# Patient Record
Sex: Female | Born: 1990 | Race: Black or African American | Hispanic: No | Marital: Single | Smoking: Former smoker
Health system: Southern US, Community
[De-identification: ages and names within clinical notes are randomized; demographics above are authoritative.]

## PROBLEM LIST (undated history)

## (undated) ENCOUNTER — Inpatient Hospital Stay (HOSPITAL_COMMUNITY): Payer: Self-pay

## (undated) DIAGNOSIS — O169 Unspecified maternal hypertension, unspecified trimester: Secondary | ICD-10-CM

## (undated) HISTORY — DX: Unspecified maternal hypertension, unspecified trimester: O16.9

---
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
## 2009-02-03 ENCOUNTER — Emergency Department: Payer: Self-pay | Admitting: Emergency Medicine

## 2012-07-27 ENCOUNTER — Emergency Department: Payer: Self-pay | Admitting: Emergency Medicine

## 2012-07-27 LAB — URINALYSIS, COMPLETE
Bilirubin,UR: NEGATIVE
Ph: 5 (ref 4.5–8.0)
Protein: NEGATIVE
RBC,UR: 3 /HPF (ref 0–5)
Squamous Epithelial: 8
WBC UR: 7 /HPF (ref 0–5)

## 2012-12-26 ENCOUNTER — Emergency Department: Payer: Self-pay | Admitting: Emergency Medicine

## 2012-12-26 LAB — COMPREHENSIVE METABOLIC PANEL
Albumin: 4.5 g/dL (ref 3.4–5.0)
BUN: 9 mg/dL (ref 7–18)
Bilirubin,Total: 0.7 mg/dL (ref 0.2–1.0)
Calcium, Total: 9.7 mg/dL (ref 8.5–10.1)
Creatinine: 0.77 mg/dL (ref 0.60–1.30)
EGFR (Non-African Amer.): >60
Glucose: 80 mg/dL (ref 65–99)
Potassium: 3.9 mmol/L (ref 3.5–5.1)
SGOT(AST): 24 U/L (ref 15–37)
SGPT (ALT): 17 U/L (ref 12–78)
Sodium: 135 mmol/L — ABNORMAL LOW (ref 136–145)
Total Protein: 8.9 g/dL — ABNORMAL HIGH (ref 6.4–8.2)

## 2012-12-26 LAB — URINALYSIS, COMPLETE
Bilirubin,UR: NEGATIVE
Leukocyte Esterase: NEGATIVE
Nitrite: NEGATIVE
Ph: 5 (ref 4.5–8.0)
Protein: 30
RBC,UR: 2 /HPF (ref 0–5)
Specific Gravity: 1.031 (ref 1.003–1.030)
Squamous Epithelial: 7

## 2012-12-26 LAB — CBC
MCH: 30.9 pg (ref 26.0–34.0)
MCHC: 35.1 g/dL (ref 32.0–36.0)
Platelet: 231 10*3/uL (ref 150–440)
RDW: 16.3 % — ABNORMAL HIGH (ref 11.5–14.5)
WBC: 10.5 10*3/uL (ref 3.6–11.0)

## 2012-12-26 LAB — GC/CHLAMYDIA PROBE AMP

## 2012-12-26 LAB — WET PREP, GENITAL

## 2014-07-04 ENCOUNTER — Emergency Department: Admit: 2014-07-04 | Disposition: A | Discharge status: Home / Self Care | Payer: Self-pay | Admitting: Internal Medicine

## 2014-07-04 LAB — URINALYSIS, COMPLETE
BILIRUBIN, UR: NEGATIVE
BLOOD: NEGATIVE
GLUCOSE, UR: NEGATIVE mg/dL (ref 0–75)
Ketone: NEGATIVE
Nitrite: NEGATIVE
Ph: 6 (ref 4.5–8.0)
Protein: 30
SPECIFIC GRAVITY: 1.026 (ref 1.003–1.030)

## 2014-11-23 ENCOUNTER — Emergency Department
Admission: EM | Admit: 2014-11-23 | Discharge: 2014-11-23 | Disposition: A | Discharge status: Home / Self Care | Payer: Medicaid Other | Attending: Emergency Medicine | Admitting: Emergency Medicine

## 2014-11-23 ENCOUNTER — Encounter: Payer: Self-pay | Admitting: Emergency Medicine

## 2014-11-23 DIAGNOSIS — R102 Pelvic and perineal pain: Secondary | ICD-10-CM | POA: Diagnosis present

## 2014-11-23 DIAGNOSIS — N390 Urinary tract infection, site not specified: Secondary | ICD-10-CM | POA: Diagnosis not present

## 2014-11-23 DIAGNOSIS — Z3202 Encounter for pregnancy test, result negative: Secondary | ICD-10-CM | POA: Diagnosis not present

## 2014-11-23 DIAGNOSIS — Z79899 Other long term (current) drug therapy: Secondary | ICD-10-CM | POA: Insufficient documentation

## 2014-11-23 LAB — WET PREP, GENITAL
CLUE CELLS WET PREP: NONE SEEN
TRICH WET PREP: NONE SEEN
YEAST WET PREP: NONE SEEN

## 2014-11-23 LAB — URINALYSIS COMPLETE WITH MICROSCOPIC (ARMC ONLY)
Bilirubin Urine: NEGATIVE
Glucose, UA: NEGATIVE mg/dL
Hgb urine dipstick: NEGATIVE
Ketones, ur: NEGATIVE mg/dL
Nitrite: NEGATIVE
PH: 6 (ref 5.0–8.0)
PROTEIN: 30 mg/dL — AB
Specific Gravity, Urine: 1.028 (ref 1.005–1.030)

## 2014-11-23 LAB — PREGNANCY, URINE: Preg Test, Ur: NEGATIVE

## 2014-11-23 LAB — CHLAMYDIA/NGC RT PCR (ARMC ONLY)
Chlamydia Tr: NOT DETECTED
N GONORRHOEAE: NOT DETECTED

## 2014-11-23 LAB — POCT PREGNANCY, URINE: Preg Test, Ur: NEGATIVE

## 2014-11-23 MED ORDER — CEPHALEXIN 500 MG PO CAPS: 500.0000 mg | ORAL_CAPSULE | Freq: Two times a day (BID) | ORAL | Status: DC

## 2014-11-23 NOTE — ED Notes (Addendum)
Pt to ed with c/o lower abd pain and pelvic pain x multiple years. States she has been seen for same but "they never find anything wrong with me" Pt states pain feels like "cramps".  Pt denies burning, denies vaginal d/c.  Pt initially reports LMP was recent but then states it was over a month ago and then reported,  "I don't really keep up with that stuff"  Pt texting on phone during triage and does not answer questions.

## 2014-11-23 NOTE — Discharge Instructions (Signed)

## 2014-11-23 NOTE — ED Notes (Signed)
Waiting for urine pregnancy results per patient's request before discharging patient.

## 2014-11-23 NOTE — ED Provider Notes (Addendum)
[facility] Emergency Department Provider Note  ____________________________________________  Time seen: 10:30 AM  I have reviewed the triage vital signs and the nursing notes.   HISTORY  Chief Complaint Pelvic Pain   HPI Ann Padilla is a 24 y.o. female who presents with complaints of mild pelvic cramping. She reports this is been intermittently occurring for several years and has never received diagnosis although she does tell me that she was told she has endometriosis when she was 24 years old. She admits to some mild vaginal discharge. Her last period was greater than a month ago but she has recently come off Implanon so she thinks that is why her periods are regular. She denies fevers chills. No dysuria. No nausea vomiting. No diarrhea. Pain is mild and more aching in nature and primarily central.     History reviewed. No pertinent past medical history.  There are no active problems to display for this patient.   History reviewed. No pertinent past surgical history.  Current Outpatient Rx  Name  Route  Sig  Dispense  Refill  . Multiple Vitamin (MULTIVITAMIN WITH MINERALS) TABS tablet   Oral   Take 1 tablet by mouth daily.           Allergies Review of patient's allergies indicates no known allergies.  History reviewed. No pertinent family history.  Social History Social History  Substance Use Topics  . Smoking status: Never Smoker   . Smokeless tobacco: None  . Alcohol Use: No    Review of Systems  Constitutional: Negative for fever. Eyes: Negative for discharge or redness ENT: Negative for sore throat Cardiovascular: Negative for chest pain. Respiratory: Negative for cough Gastrointestinal: Negative for abdominal pain, vomiting and diarrhea. Genitourinary: Negative for dysuria.positive vaginal discharge Musculoskeletal: Negative for back pain. Skin: Negative for rash. Neurological: Negative for headaches  Psychiatric:no  anxiety    ____________________________________________   PHYSICAL EXAM:  VITAL SIGNS: ED Triage Vitals  Enc Vitals Group     BP 11/23/14 0929 98/62 mmHg     Pulse Rate 11/23/14 0929 71     Resp 11/23/14 0929 20     Temp 11/23/14 0929 98.2 F (36.8 C)     Temp Source 11/23/14 0929 Oral     SpO2 11/23/14 0929 100 %     Weight 11/23/14 0929 115 lb (52.164 kg)     Height 11/23/14 0929  (1.575 m)     Head Cir --      Peak Flow --      Pain Score 11/23/14 0930 6     Pain Loc --      Pain Edu? --      Excl. in GC? --      Constitutional: Alert and oriented. Well appearing and in no distress.text on file Eyes: Conjunctivae are normal.  ENT   Head: Normocephalic and atraumatic.   Mouth/Throat: Mucous membranes are moist. Cardiovascular: Normal rate, regular rhythm.  Respiratory: Normal respiratory effort without tachypnea nor retractions. Breath sounds are clear and equal bilaterally.  Gastrointestinal: Soft and non-tender in all quadrants. No distention. There is no CVA tenderness. Genitourinary: no CMT, thin white discharge, no adnexal tenderness or masses Musculoskeletal: Nontender with normal range of motion in all extremities.  Neurologic:  Normal speech and language. . Skin:  Skin is warm, dry and intact. No rash noted. Psychiatric: Mood and affect are normal. Patient exhibits appropriate insight and judgment.  ____________________________________________    LABS (pertinent positives/negatives)  Labs Reviewed  WET PREP, GENITAL  CHLAMYDIA/NGC RT PCR ([facility] ONLY)  URINALYSIS COMPLETEWITH MICROSCOPIC ([facility] ONLY)  RPR    ____________________________________________   EKG  None  ____________________________________________    RADIOLOGY I have personally reviewed any xrays that were ordered on this patient: None  ____________________________________________   PROCEDURES  Procedure(s) performed: none  Critical Care  performed:none  ____________________________________________   INITIAL IMPRESSION / ASSESSMENT AND PLAN / ED COURSE  Pertinent labs & imaging results that were available during my care of the patient were reviewed by me and considered in my medical decision making (see chart for details).  Patient in no distress. This appears to be somewhat chronic problem. She does report vaginal discharge we will perform a GYN exam and send a wet prep and a pregnancy test. I do not think labs will be useful nor do I think imaging will be helpful as she does not have acute pain on one side or the other.   ----------------------------------------- 12:03 PM on 11/23/2014 -----------------------------------------  Wet prep is unremarkable. Urinesuspicious for a urinary tract infection. We will treat with antibiotics and have her follow-up with her cardiologist. ____________________________________________ Confirmed with nurse that POCT pregnancy was negative  FINAL CLINICAL IMPRESSION(S) / ED DIAGNOSES  Final diagnoses:  UTI (lower urinary tract infection)     Jene Every, MD 11/23/14 1203  Jene Every, MD 11/23/14 [phone]

## 2014-11-24 LAB — RPR: RPR Ser Ql: NONREACTIVE

## 2014-11-27 ENCOUNTER — Encounter: Payer: Self-pay | Admitting: Emergency Medicine

## 2014-11-27 ENCOUNTER — Emergency Department
Admission: EM | Admit: 2014-11-27 | Discharge: 2014-11-27 | Disposition: A | Discharge status: Home / Self Care | Payer: Medicaid Other | Attending: Student | Admitting: Student

## 2014-11-27 DIAGNOSIS — K5901 Slow transit constipation: Secondary | ICD-10-CM | POA: Insufficient documentation

## 2014-11-27 DIAGNOSIS — R110 Nausea: Secondary | ICD-10-CM

## 2014-11-27 DIAGNOSIS — Z792 Long term (current) use of antibiotics: Secondary | ICD-10-CM | POA: Diagnosis not present

## 2014-11-27 DIAGNOSIS — Z3202 Encounter for pregnancy test, result negative: Secondary | ICD-10-CM | POA: Insufficient documentation

## 2014-11-27 DIAGNOSIS — Z79899 Other long term (current) drug therapy: Secondary | ICD-10-CM | POA: Insufficient documentation

## 2014-11-27 MED ORDER — ONDANSETRON 8 MG PO TBDP: 8.0000 mg | ORAL_TABLET | Freq: Three times a day (TID) | ORAL | Status: DC | PRN

## 2014-11-27 MED ORDER — DOCUSATE SODIUM 100 MG PO CAPS: 100.0000 mg | ORAL_CAPSULE | Freq: Every day | ORAL | Status: AC | PRN

## 2014-11-27 NOTE — ED Notes (Signed)
Pt to ed with c/o nausea, was seen last week for UTI,  Pt states yesterday she felt nauseated but never vomited.  Pt appears in nad at this time.  texting on cell phone during triage.

## 2014-11-27 NOTE — ED Provider Notes (Signed)
[facility] Emergency Department Provider Note  ____________________________________________  Time seen: Approximately 8:40 AM  I have reviewed the triage vital signs and the nursing notes.   HISTORY  Chief Complaint Nausea    HPI Ann Padilla is a 24 y.o. female patient complaining of nausea status post eating what she thought was a back cantaloupe yesterday. Patient also states she's not had a bowel movement 3-4 days. Patient is currently being treated for urinary tract infection seen 5 days ago. Patient denies any urinary sclerae at this time. No palliative measures taken for the nausea or constipation. Patient rated her pain discomfort as 8/10.   History reviewed. No pertinent past medical history.  There are no active problems to display for this patient.   History reviewed. No pertinent past surgical history.  Current Outpatient Rx  Name  Route  Sig  Dispense  Refill  . cephALEXin (KEFLEX) 500 MG capsule   Oral   Take 1 capsule (500 mg total) by mouth 2 (two) times daily.   14 capsule   0   . docusate sodium (COLACE) 100 MG capsule   Oral   Take 1 capsule (100 mg total) by mouth daily as needed.   10 capsule   0   . Multiple Vitamin (MULTIVITAMIN WITH MINERALS) TABS tablet   Oral   Take 1 tablet by mouth daily.         . ondansetron (ZOFRAN ODT) 8 MG disintegrating tablet   Oral   Take 1 tablet (8 mg total) by mouth every 8 (eight) hours as needed for nausea or vomiting.   20 tablet   0     Allergies Review of patient's allergies indicates no known allergies.  History reviewed. No pertinent family history.  Social History Social History  Substance Use Topics  . Smoking status: Never Smoker   . Smokeless tobacco: None  . Alcohol Use: No    Review of Systems Constitutional: No fever/chills Eyes: No visual changes. ENT: No sore throat. Cardiovascular: Denies chest pain. Respiratory: Denies shortness of  breath. Gastrointestinal: No abdominal pain.  Nausea without vomiting  No diarrhea.  constipation. Genitourinary: Negative for dysuria. Musculoskeletal: Negative for back pain. Skin: Negative for rash. Neurological: Negative for headaches, focal weakness or numbness.  10-point ROS otherwise negative.  ____________________________________________   PHYSICAL EXAM:  VITAL SIGNS: ED Triage Vitals  Enc Vitals Group     BP 11/27/14 0824 107/71 mmHg     Pulse Rate 11/27/14 0824 78     Resp 11/27/14 0824 20     Temp 11/27/14 0824 98 F (36.7 C)     Temp Source 11/27/14 0824 Oral     SpO2 11/27/14 0824 100 %     Weight 11/27/14 0824 115 lb (52.164 kg)     Height 11/27/14 0824  (1.575 m)     Head Cir --      Peak Flow --      Pain Score 11/27/14 0824 8     Pain Loc --      Pain Edu? --      Excl. in GC? --     Constitutional: Alert and oriented. Well appearing and in no acute distress. Eyes: Conjunctivae are normal. PERRL. EOMI. Head: Atraumatic. Nose: No congestion/rhinnorhea. Mouth/Throat: Mucous membranes are moist.  Oropharynx non-erythematous. Neck: No stridor.   Cardiovascular: Normal rate, regular rhythm. Grossly normal heart sounds.  Good peripheral circulation. Respiratory: Normal respiratory effort.  No retractions. Lungs CTAB. Gastrointestinal: Soft and  nontender. Mild distention. No abdominal bruits. No CVA tenderness. Decreased bowel sounds Musculoskeletal: No lower extremity tenderness nor edema.  No joint effusions. Neurologic:  Normal speech and language. No gross focal neurologic deficits are appreciated. No gait instability. Skin:  Skin is warm, dry and intact. No rash noted. Psychiatric: Mood and affect are normal. Speech and behavior are normal.  ____________________________________________   LABS (all labs ordered are listed, but only abnormal results are displayed)  Labs Reviewed  POC URINE PREG, ED    ____________________________________________  EKG   ____________________________________________  RADIOLOGY   ____________________________________________   PROCEDURES  Procedure(s) performed: None  Critical Care performed: No  ____________________________________________   INITIAL IMPRESSION / ASSESSMENT AND PLAN / ED COURSE  Pertinent labs & imaging results that were available during my care of the patient were reviewed by me and considered in my medical decision making (see chart for details).  Nausea mild constipation. Patient given a prescription for Colace and Zofran. Patient working for the day. Advised to [facility] complaint [facility]. ____________________________________________   FINAL CLINICAL IMPRESSION(S) / ED DIAGNOSES  Final diagnoses:  Nausea  Slow transit constipation       Joni Reining, PA-C 11/27/14 1914  Gayla Doss, MD 11/27/14 1635

## 2014-11-28 LAB — POCT PREGNANCY, URINE: PREG TEST UR: NEGATIVE

## 2015-07-14 ENCOUNTER — Emergency Department: Payer: Medicaid Other

## 2015-07-14 ENCOUNTER — Emergency Department
Admission: EM | Admit: 2015-07-14 | Discharge: 2015-07-14 | Disposition: A | Discharge status: Home / Self Care | Payer: Medicaid Other | Attending: Emergency Medicine | Admitting: Emergency Medicine

## 2015-07-14 ENCOUNTER — Encounter: Payer: Self-pay | Admitting: Emergency Medicine

## 2015-07-14 DIAGNOSIS — Z3491 Encounter for supervision of normal pregnancy, unspecified, first trimester: Secondary | ICD-10-CM

## 2015-07-14 DIAGNOSIS — R109 Unspecified abdominal pain: Secondary | ICD-10-CM

## 2015-07-14 DIAGNOSIS — N760 Acute vaginitis: Secondary | ICD-10-CM | POA: Insufficient documentation

## 2015-07-14 DIAGNOSIS — R1030 Lower abdominal pain, unspecified: Secondary | ICD-10-CM | POA: Diagnosis present

## 2015-07-14 DIAGNOSIS — N390 Urinary tract infection, site not specified: Secondary | ICD-10-CM | POA: Diagnosis not present

## 2015-07-14 DIAGNOSIS — O26891 Other specified pregnancy related conditions, first trimester: Secondary | ICD-10-CM | POA: Diagnosis not present

## 2015-07-14 DIAGNOSIS — Z3A00 Weeks of gestation of pregnancy not specified: Secondary | ICD-10-CM | POA: Insufficient documentation

## 2015-07-14 DIAGNOSIS — B9689 Other specified bacterial agents as the cause of diseases classified elsewhere: Secondary | ICD-10-CM

## 2015-07-14 LAB — CBC WITH DIFFERENTIAL/PLATELET
Basophils Absolute: 0.1 10*3/uL (ref 0–0.1)
Basophils Relative: 1 %
Eosinophils Absolute: 0.1 10*3/uL (ref 0–0.7)
HEMATOCRIT: 41.7 % (ref 35.0–47.0)
HEMOGLOBIN: 14.1 g/dL (ref 12.0–16.0)
LYMPHS ABS: 2.6 10*3/uL (ref 1.0–3.6)
MCH: 31.2 pg (ref 26.0–34.0)
MCHC: 33.8 g/dL (ref 32.0–36.0)
MCV: 92.4 fL (ref 80.0–100.0)
Monocytes Absolute: 0.7 10*3/uL (ref 0.2–0.9)
Monocytes Relative: 6 %
NEUTROS ABS: 8 10*3/uL — AB (ref 1.4–6.5)
Neutrophils Relative %: 69 %
Platelets: 226 10*3/uL (ref 150–440)
RBC: 4.52 MIL/uL (ref 3.80–5.20)
RDW: 13.8 % (ref 11.5–14.5)
WBC: 11.4 10*3/uL — AB (ref 3.6–11.0)

## 2015-07-14 LAB — URINALYSIS COMPLETE WITH MICROSCOPIC (ARMC ONLY)
BILIRUBIN URINE: NEGATIVE
GLUCOSE, UA: NEGATIVE mg/dL
HGB URINE DIPSTICK: NEGATIVE
Nitrite: NEGATIVE
Protein, ur: NEGATIVE mg/dL
Specific Gravity, Urine: 1.031 — ABNORMAL HIGH (ref 1.005–1.030)
pH: 5 (ref 5.0–8.0)

## 2015-07-14 LAB — COMPREHENSIVE METABOLIC PANEL
ALK PHOS: 66 U/L (ref 38–126)
ALT: 12 U/L — ABNORMAL LOW (ref 14–54)
ANION GAP: 6 (ref 5–15)
AST: 21 U/L (ref 15–41)
Albumin: 4.6 g/dL (ref 3.5–5.0)
BILIRUBIN TOTAL: 0.7 mg/dL (ref 0.3–1.2)
BUN: 13 mg/dL (ref 6–20)
CALCIUM: 9.6 mg/dL (ref 8.9–10.3)
CO2: 23 mmol/L (ref 22–32)
Chloride: 112 mmol/L — ABNORMAL HIGH (ref 101–111)
Creatinine, Ser: 0.77 mg/dL (ref 0.44–1.00)
GFR calc Af Amer: >60 mL/min (ref >60)
GLUCOSE: 85 mg/dL (ref 65–99)
Potassium: 3.5 mmol/L (ref 3.5–5.1)
Sodium: 141 mmol/L (ref 135–145)
TOTAL PROTEIN: 8.3 g/dL — AB (ref 6.5–8.1)

## 2015-07-14 LAB — HCG, QUANTITATIVE, PREGNANCY: hCG, Beta Chain, Quant, S: 3428 m[IU]/mL — ABNORMAL HIGH (ref <5)

## 2015-07-14 LAB — WET PREP, GENITAL
SPERM: NONE SEEN
TRICH WET PREP: NONE SEEN
Yeast Wet Prep HPF POC: NONE SEEN

## 2015-07-14 MED ORDER — CEPHALEXIN 500 MG PO CAPS: 500.0000 mg | ORAL_CAPSULE | Freq: Two times a day (BID) | ORAL | Status: DC

## 2015-07-14 MED ORDER — CEPHALEXIN 500 MG PO CAPS
500.0000 mg | ORAL_CAPSULE | Freq: Once | ORAL | Status: AC
  Administered 2015-07-14: 500 mg via ORAL
  Filled 2015-07-14: qty 1

## 2015-07-14 NOTE — ED Provider Notes (Signed)
[facility] Emergency Department Provider Note   ____________________________________________  Time seen: I have reviewed the triage vital signs and the triage nursing note.  HISTORY  Chief Complaint Abdominal Pain   Historian Patient  HPI Ann Padilla is a 25 y.o. female who is here for evaluation of lower abdominal pelvic cramping pain. Her last period was sometime last month. She did take a pregnancy test at home and is found to be positive. She's had no significant discharge and no vaginal bleeding. No coughing or chest pain. No back pain. Pain is mild to moderate. No bowel or bladder symptoms, although she does state that she had a UTI recently completed antibiotics for that. Nothing makes it worse or better.    History reviewed. No pertinent past medical history.  There are no active problems to display for this patient.   No past surgical history on file.  Current Outpatient Rx  Name  Route  Sig  Dispense  Refill  . cephALEXin (KEFLEX) 500 MG capsule   Oral   Take 1 capsule (500 mg total) by mouth 2 (two) times daily.   14 capsule   0   . docusate sodium (COLACE) 100 MG capsule   Oral   Take 1 capsule (100 mg total) by mouth daily as needed.   10 capsule   0   . Multiple Vitamin (MULTIVITAMIN WITH MINERALS) TABS tablet   Oral   Take 1 tablet by mouth daily.         . ondansetron (ZOFRAN ODT) 8 MG disintegrating tablet   Oral   Take 1 tablet (8 mg total) by mouth every 8 (eight) hours as needed for nausea or vomiting.   20 tablet   0     Allergies Review of patient's allergies indicates no known allergies.  No family history on file.  Social History Social History  Substance Use Topics  . Smoking status: Never Smoker   . Smokeless tobacco: None  . Alcohol Use: No    Review of Systems  Constitutional: Negative for fever. Eyes: Negative for visual changes. ENT: Negative for sore throat. Cardiovascular: Negative for  chest pain. Respiratory: Negative for shortness of breath. Gastrointestinal: Negative for abdominal pain, vomiting and diarrhea. Genitourinary: Negative for dysuria. Musculoskeletal: Negative for back pain. Skin: Negative for rash. Neurological: Negative for headache. 10 point Review of Systems otherwise negative ____________________________________________   PHYSICAL EXAM:  VITAL SIGNS: ED Triage Vitals  Enc Vitals Group     BP 07/14/15 1801 123/79 mmHg     Pulse Rate 07/14/15 1801 91     Resp 07/14/15 1801 20     Temp 07/14/15 1801 98.4 F (36.9 C)     Temp Source 07/14/15 1801 Oral     SpO2 07/14/15 1801 99 %     Weight 07/14/15 1801 120 lb (54.432 kg)     Height 07/14/15 1801  (1.575 m)     Head Cir --      Peak Flow --      Pain Score --      Pain Loc --      Pain Edu? --      Excl. in GC? --      Constitutional: Alert and oriented. Well appearing and in no distress. HEENT   Head: Normocephalic and atraumatic.      Eyes: Conjunctivae are normal. PERRL. Normal extraocular movements.      Ears:         Nose: No congestion/rhinnorhea.  Mouth/Throat: Mucous membranes are moist.   Neck: No stridor. Cardiovascular/Chest: Normal rate, regular rhythm.  No murmurs, rubs, or gallops. Respiratory: Normal respiratory effort without tachypnea nor retractions. Breath sounds are clear and equal bilaterally. No wheezes/rales/rhonchi. Gastrointestinal: Soft. No distention, no guarding, no rebound. Very mild suprapubic discomfort.  Genitourinary/rectal:  No discharge, no odor.  No bleeding. No cmt or adnexal mass. Musculoskeletal: Nontender with normal range of motion in all extremities. No joint effusions.  No lower extremity tenderness.  No edema. Neurologic:  Normal speech and language. No gross or focal neurologic deficits are appreciated. Skin:  Skin is warm, dry and intact. No rash noted. Psychiatric: Mood and affect are normal. Speech and behavior are normal.  Patient exhibits appropriate insight and judgment.  ____________________________________________   EKG I, Governor Rooksebecca Katanya Schlie, MD, the attending physician have personally viewed and interpreted all ECGs.  none ____________________________________________  LABS (pertinent positives/negatives)  Beta hCG 3248  White blood count 11.4, hemoglobin 14.1 and platelet count 26 Comprehensive metabolic panel without significant abnormality Wet prep clue cells present, few white blood cells Urinalysis  urinalysis trace leukocytes, 6-30 white blood cells and rare bacteria. ____________________________________________  RADIOLOGY All Xrays were viewed by me. Imaging interpreted by Radiologist.  Ultrasound pelvic:  IMPRESSION: Probable early intrauterine gestational sac, but no yolk sac, fetal pole, or cardiac activity yet visualized. Recommend follow-up quantitative B-HCG levels and follow-up US in 14 days to confirm and assess viability. This recommendation follows SRU consensus guidelines: Diagnostic Criteria for Nonviable Pregnancy Early in the First Trimester. Malva Limes Engl J Med 2013; 369:1443-51. __________________________________________  PROCEDURES  Procedure(s) performed: None  Critical Care performed: None  ____________________________________________   ED COURSE / ASSESSMENT AND PLAN  Pertinent labs & imaging results that were available during my care of the patient were reviewed by me and considered in my medical decision making (see chart for details).   Patient is having some pelvic cramping and a small amount of vaginal bleeding/spotting yesterday. On pelvic exam here, no bleeding.    On abdominal exam, no focal right lower quadrant tenderness, and just a little bit of suprapubic discomfort. I'm not highly suspicious for another intra-abdominal surgical emergency such as appendicitis.  Patient is on clear when her last menstrual period was, but seems like probably about 5 weeks  ago, which is consistent with her gestational sac measuring approximately 5 weeks. No fetal heart rate appreciated on this potentially early exam.  Patient was instructed to follow with OB/GYN, with likely repeat beta hCG and ultrasound.  Symptoms do not seem highly concerning for ectopic pregnancy.  BV on wet prep - pt does not complain of symptoms -- will avoid treatment of asymptomatic in first trimester -- to follow up with OB gyn to discuss further.  UA consistent with urinary tract infection. A culture was sent. Patient was given Keflex as well as prescription.  CONSULTATIONS:   None   Patient / Family / Caregiver informed of clinical course, medical decision-making process, and agree with plan.   I discussed return precautions, follow-up instructions, and discharged instructions with patient and/or family.   ___________________________________________   FINAL CLINICAL IMPRESSION(S) / ED DIAGNOSES   Final diagnoses:  Abdominal pain  First trimester pregnancy  BV (bacterial vaginosis)  UTI (lower urinary tract infection)              Note: This dictation was prepared with Dragon dictation. Any transcriptional errors that result from this process are unintentional   Governor Rooksebecca Meryl Ponder, MD 07/14/15 2337

## 2015-07-14 NOTE — ED Notes (Signed)
Patient transported to Ultrasound 

## 2015-07-14 NOTE — ED Notes (Signed)
Pt up to attempt to obtain urine sample.

## 2015-07-14 NOTE — ED Notes (Signed)
States has had abdominal cramping yesterday, states took home pregnancy and got positive result, unsure last period, denies vaginal bleeding.

## 2015-07-14 NOTE — Discharge Instructions (Signed)
You were evaluated for pelvic cramping, and found to have positive pregnancy test. Ultrasound shows early sac, but no heart rate, and this may be because the pregnancy is too early, however failed pregnancy or miscarriage is still a consideration.  He will need repeat pregnancy hormone level and ultrasound, usually in 1-2 weeks. Call OB/GYN to make an appointment for follow-up.  Return to the emergency department for any worsening condition including any new or worsening abdominal pain, vaginal bleeding, fever, dizziness or passing out, or any other symptoms concerning to you.   First Trimester of Pregnancy The first trimester of pregnancy is from week 1 until the end of week 12 (months 1 through 3). A week after a sperm fertilizes an egg, the egg will implant on the wall of the uterus. This embryo will begin to develop into a baby. Genes from you and your partner are forming the baby. The female genes determine whether the baby is a boy or a girl. At 6-8 weeks, the eyes and face are formed, and the heartbeat can be seen on ultrasound. At the end of 12 weeks, all the baby's organs are formed.  Now that you are pregnant, you will want to do everything you can to have a healthy baby. Two of the most important things are to get good prenatal care and to follow your health care provider's instructions. Prenatal care is all the medical care you receive before the baby's birth. This care will help prevent, find, and treat any problems during the pregnancy and childbirth. BODY CHANGES Your body goes through many changes during pregnancy. The changes vary from woman to woman.   You may gain or lose a couple of pounds at first.  You may feel sick to your stomach (nauseous) and throw up (vomit). If the vomiting is uncontrollable, call your health care provider.  You may tire easily.  You may develop headaches that can be relieved by medicines approved by your health care provider.  You may urinate more  often. Painful urination may mean you have a bladder infection.  You may develop heartburn as a result of your pregnancy.  You may develop constipation because certain hormones are causing the muscles that push waste through your intestines to slow down.  You may develop hemorrhoids or swollen, bulging veins (varicose veins).  Your breasts may begin to grow larger and become tender. Your nipples may stick out more, and the tissue that surrounds them (areola) may become darker.  Your gums may bleed and may be sensitive to brushing and flossing.  Dark spots or blotches (chloasma, mask of pregnancy) may develop on your face. This will likely fade after the baby is born.  Your menstrual periods will stop.  You may have a loss of appetite.  You may develop cravings for certain kinds of food.  You may have changes in your emotions from day to day, such as being excited to be pregnant or being concerned that something may go wrong with the pregnancy and baby.  You may have more vivid and strange dreams.  You may have changes in your hair. These can include thickening of your hair, rapid growth, and changes in texture. Some women also have hair loss during or after pregnancy, or hair that feels dry or thin. Your hair will most likely return to normal after your baby is born. WHAT TO EXPECT AT YOUR PRENATAL VISITS During a routine prenatal visit:  You will be weighed to make sure you and the  baby are growing normally.  Your blood pressure will be taken.  Your abdomen will be measured to track your baby's growth.  The fetal heartbeat will be listened to starting around week 10 or 12 of your pregnancy.  Test results from any previous visits will be discussed. Your health care provider may ask you:  How you are feeling.  If you are feeling the baby move.  If you have had any abnormal symptoms, such as leaking fluid, bleeding, severe headaches, or abdominal cramping.  If you are using  any tobacco products, including cigarettes, chewing tobacco, and electronic cigarettes.  If you have any questions. Other tests that may be performed during your first trimester include:  Blood tests to find your blood type and to check for the presence of any previous infections. They will also be used to check for low iron levels (anemia) and Rh antibodies. Later in the pregnancy, blood tests for diabetes will be done along with other tests if problems develop.  Urine tests to check for infections, diabetes, or protein in the urine.  An ultrasound to confirm the proper growth and development of the baby.  An amniocentesis to check for possible genetic problems.  Fetal screens for spina bifida and Down syndrome.  You may need other tests to make sure you and the baby are doing well.  HIV (human immunodeficiency virus) testing. Routine prenatal testing includes screening for HIV, unless you choose not to have this test. HOME CARE INSTRUCTIONS  Medicines  Follow your health care provider's instructions regarding medicine use. Specific medicines may be either safe or unsafe to take during pregnancy.  Take your prenatal vitamins as directed.  If you develop constipation, try taking a stool softener if your health care provider approves. Diet  Eat regular, well-balanced meals. Choose a variety of foods, such as meat or vegetable-based protein, fish, milk and low-fat dairy products, vegetables, fruits, and whole grain breads and cereals. Your health care provider will help you determine the amount of weight gain that is right for you.  Avoid raw meat and uncooked cheese. These carry germs that can cause birth defects in the baby.  Eating four or five small meals rather than three large meals a day may help relieve nausea and vomiting. If you start to feel nauseous, eating a few soda crackers can be helpful. Drinking liquids between meals instead of during meals also seems to help nausea  and vomiting.  If you develop constipation, eat more high-fiber foods, such as fresh vegetables or fruit and whole grains. Drink enough fluids to keep your urine clear or pale yellow. Activity and Exercise  Exercise only as directed by your health care provider. Exercising will help you:  Control your weight.  Stay in shape.  Be prepared for labor and delivery.  Experiencing pain or cramping in the lower abdomen or low back is a good sign that you should stop exercising. Check with your health care provider before continuing normal exercises.  Try to avoid standing for long periods of time. Move your legs often if you must stand in one place for a long time.  Avoid heavy lifting.  Wear low-heeled shoes, and practice good posture.  You may continue to have sex unless your health care provider directs you otherwise. Relief of Pain or Discomfort  Wear a good support bra for breast tenderness.   Take warm sitz baths to soothe any pain or discomfort caused by hemorrhoids. Use hemorrhoid cream if your health care provider  approves.   Rest with your legs elevated if you have leg cramps or low back pain.  If you develop varicose veins in your legs, wear support hose. Elevate your feet for 15 minutes, 3-4 times a day. Limit salt in your diet. Prenatal Care  Schedule your prenatal visits by the twelfth week of pregnancy. They are usually scheduled monthly at first, then more often in the last 2 months before delivery.  Write down your questions. Take them to your prenatal visits.  Keep all your prenatal visits as directed by your health care provider. Safety  Wear your seat belt at all times when driving.  Make a list of emergency phone numbers, including numbers for family, friends, the hospital, and police and fire departments. General Tips  Ask your health care provider for a referral to a local prenatal education class. Begin classes no later than at the beginning of month 6  of your pregnancy.  Ask for help if you have counseling or nutritional needs during pregnancy. Your health care provider can offer advice or refer you to specialists for help with various needs.  Do not use hot tubs, steam rooms, or saunas.  Do not douche or use tampons or scented sanitary pads.  Do not cross your legs for long periods of time.  Avoid cat litter boxes and soil used by cats. These carry germs that can cause birth defects in the baby and possibly loss of the fetus by miscarriage or stillbirth.  Avoid all smoking, herbs, alcohol, and medicines not prescribed by your health care provider. Chemicals in these affect the formation and growth of the baby.  Do not use any tobacco products, including cigarettes, chewing tobacco, and electronic cigarettes. If you need help quitting, ask your health care provider. You may receive counseling support and other resources to help you quit.  Schedule a dentist appointment. At home, brush your teeth with a soft toothbrush and be gentle when you floss. SEEK MEDICAL CARE IF:   You have dizziness.  You have mild pelvic cramps, pelvic pressure, or nagging pain in the abdominal area.  You have persistent nausea, vomiting, or diarrhea.  You have a bad smelling vaginal discharge.  You have pain with urination.  You notice increased swelling in your face, hands, legs, or ankles. SEEK IMMEDIATE MEDICAL CARE IF:   You have a fever.  You are leaking fluid from your vagina.  You have spotting or bleeding from your vagina.  You have severe abdominal cramping or pain.  You have rapid weight gain or loss.  You vomit blood or material that looks like coffee grounds.  You are exposed to [place] measles and have never had them.  You are exposed to fifth disease or chickenpox.  You develop a severe headache.  You have shortness of breath.  You have any kind of trauma, such as from a fall or a car accident.   This information is not  intended to replace advice given to you by your health care provider. Make sure you discuss any questions you have with your health care provider.   Document Released: 02/18/2001 Document Revised: 03/17/2014 Document Reviewed: 01/04/2013 Elsevier Interactive Patient Education 2016 [employer]  Abdominal Pain, Adult Many things can cause abdominal pain. Usually, abdominal pain is not caused by a disease and will improve without treatment. It can often be observed and treated at home. Your health care provider will do a physical exam and possibly order blood tests and X-rays to help determine  the seriousness of your pain. However, in many cases, more time must pass before a clear cause of the pain can be found. Before that point, your health care provider may not know if you need more testing or further treatment. HOME CARE INSTRUCTIONS Monitor your abdominal pain for any changes. The following actions may help to alleviate any discomfort you are experiencing:  Only take over-the-counter or prescription medicines as directed by your health care provider.  Do not take laxatives unless directed to do so by your health care provider.  Try a clear liquid diet (broth, tea, or water) as directed by your health care provider. Slowly move to a bland diet as tolerated. SEEK MEDICAL CARE IF:  You have unexplained abdominal pain.  You have abdominal pain associated with nausea or diarrhea.  You have pain when you urinate or have a bowel movement.  You experience abdominal pain that wakes you in the night.  You have abdominal pain that is worsened or improved by eating food.  You have abdominal pain that is worsened with eating fatty foods.  You have a fever. SEEK IMMEDIATE MEDICAL CARE IF:  Your pain does not go away within 2 hours.  You keep throwing up (vomiting).  Your pain is felt only in portions of the abdomen, such as the right side or the left lower portion of the abdomen.  You  pass bloody or black tarry stools. MAKE SURE YOU:  Understand these instructions.  Will watch your condition.  Will get help right away if you are not doing well or get worse.   This information is not intended to replace advice given to you by your health care provider. Make sure you discuss any questions you have with your health care provider.   Document Released: 12/04/2004 Document Revised: 11/15/2014 Document Reviewed: 11/03/2012 Elsevier Interactive Patient Education 2016 [employer]   Pregnancy and Urinary Tract Infection A urinary tract infection (UTI) is a bacterial infection of the urinary tract. Infection of the urinary tract can include the ureters, kidneys (pyelonephritis), bladder (cystitis), and urethra (urethritis). All pregnant women should be screened for bacteria in the urinary tract. Identifying and treating a UTI will decrease the risk of preterm labor and developing more serious infections in both the mother and baby. CAUSES Bacteria germs cause almost all UTIs.  RISK FACTORS Many factors can increase your chances of getting a UTI during pregnancy. These include:  Having a short urethra.  Poor toilet and hygiene habits.  Sexual intercourse.  Blockage of urine along the urinary tract.  Problems with the pelvic muscles or nerves.  Diabetes.  Obesity.  Bladder problems after having several children.  Previous history of UTI. SIGNS AND SYMPTOMS   Pain, burning, or a stinging feeling when urinating.  Suddenly feeling the need to urinate right away (urgency).  Loss of bladder control (urinary incontinence).  Frequent urination, more than is common with pregnancy.  Lower abdominal or back discomfort.  Cloudy urine.  Blood in the urine (hematuria).  Fever. When the kidneys are infected, the symptoms may be:  Back pain.  Flank pain on the right side more so than the left.  Fever.  Chills.  Nausea.  Vomiting. DIAGNOSIS  A urinary  tract infection is usually diagnosed through urine tests. Additional tests and procedures are sometimes done. These may include:  Ultrasound exam of the kidneys, ureters, bladder, and urethra.  Looking in the bladder with a lighted tube (cystoscopy). TREATMENT Typically, UTIs can be treated with antibiotic  medicines.  HOME CARE INSTRUCTIONS   Only take over-the-counter or prescription medicines as directed by your health care provider. If you were prescribed antibiotics, take them as directed. Finish them even if you start to feel better.  Drink enough fluids to keep your urine clear or pale yellow.  Do not have sexual intercourse until the infection is gone and your health care provider says it is okay.  Make sure you are tested for UTIs throughout your pregnancy. These infections often come back. Preventing a UTI in the Future  Practice good toilet habits. Always wipe from front to back. Use the tissue only once.  Do not hold your urine. Empty your bladder as soon as possible when the urge comes.  Do not douche or use deodorant sprays.  Wash with soap and warm water around the genital area and the anus.  Empty your bladder before and after sexual intercourse.  Wear underwear with a cotton crotch.  Avoid caffeine and carbonated drinks. They can irritate the bladder.  Drink cranberry juice or take cranberry pills. This may decrease the risk of getting a UTI.  Do not drink alcohol.  Keep all your appointments and tests as scheduled. SEEK MEDICAL CARE IF:   Your symptoms get worse.  You are still having fevers 2 or more days after treatment begins.  You have a rash.  You feel that you are having problems with medicines prescribed.  You have abnormal vaginal discharge. SEEK IMMEDIATE MEDICAL CARE IF:   You have back or flank pain.  You have chills.  You have blood in your urine.  You have nausea and vomiting.  You have contractions of your uterus.  You have  a gush of fluid from the vagina. MAKE SURE YOU:  Understand these instructions.   Will watch your condition.   Will get help right away if you are not doing well or get worse.    This information is not intended to replace advice given to you by your health care provider. Make sure you discuss any questions you have with your health care provider.   Document Released: 06/21/2010 Document Revised: 12/15/2012 Document Reviewed: 09/23/2012 Elsevier Interactive Patient Education [employer]

## 2015-07-14 NOTE — ED Notes (Signed)
Pt sleeping, call bell at left side.

## 2015-07-15 LAB — CHLAMYDIA/NGC RT PCR (ARMC ONLY)
CHLAMYDIA TR: NOT DETECTED
N gonorrhoeae: NOT DETECTED

## 2015-07-17 LAB — URINE CULTURE

## 2015-09-24 ENCOUNTER — Emergency Department
Admission: EM | Admit: 2015-09-24 | Discharge: 2015-09-24 | Disposition: A | Discharge status: Home / Self Care | Payer: Medicaid Other | Attending: Emergency Medicine | Admitting: Emergency Medicine

## 2015-09-24 ENCOUNTER — Emergency Department: Payer: Medicaid Other

## 2015-09-24 ENCOUNTER — Encounter: Payer: Self-pay | Admitting: Emergency Medicine

## 2015-09-24 DIAGNOSIS — R102 Pelvic and perineal pain: Secondary | ICD-10-CM

## 2015-09-24 DIAGNOSIS — A749 Chlamydial infection, unspecified: Secondary | ICD-10-CM

## 2015-09-24 DIAGNOSIS — N760 Acute vaginitis: Secondary | ICD-10-CM

## 2015-09-24 DIAGNOSIS — O98312 Other infections with a predominantly sexual mode of transmission complicating pregnancy, second trimester: Secondary | ICD-10-CM | POA: Diagnosis not present

## 2015-09-24 DIAGNOSIS — N949 Unspecified condition associated with female genital organs and menstrual cycle: Secondary | ICD-10-CM

## 2015-09-24 DIAGNOSIS — O2342 Unspecified infection of urinary tract in pregnancy, second trimester: Secondary | ICD-10-CM | POA: Diagnosis not present

## 2015-09-24 DIAGNOSIS — Z3A14 14 weeks gestation of pregnancy: Secondary | ICD-10-CM | POA: Insufficient documentation

## 2015-09-24 DIAGNOSIS — Z79899 Other long term (current) drug therapy: Secondary | ICD-10-CM | POA: Insufficient documentation

## 2015-09-24 DIAGNOSIS — O23592 Infection of other part of genital tract in pregnancy, second trimester: Secondary | ICD-10-CM | POA: Insufficient documentation

## 2015-09-24 DIAGNOSIS — A5600 Chlamydial infection of lower genitourinary tract, unspecified: Secondary | ICD-10-CM | POA: Diagnosis not present

## 2015-09-24 DIAGNOSIS — B9689 Other specified bacterial agents as the cause of diseases classified elsewhere: Secondary | ICD-10-CM

## 2015-09-24 DIAGNOSIS — O26892 Other specified pregnancy related conditions, second trimester: Secondary | ICD-10-CM

## 2015-09-24 LAB — URINALYSIS COMPLETE WITH MICROSCOPIC (ARMC ONLY)
Bilirubin Urine: NEGATIVE
Glucose, UA: NEGATIVE mg/dL
Nitrite: NEGATIVE
PH: 6 (ref 5.0–8.0)
PROTEIN: NEGATIVE mg/dL
Specific Gravity, Urine: 1.014 (ref 1.005–1.030)

## 2015-09-24 LAB — CBC WITH DIFFERENTIAL/PLATELET
BASOS ABS: 0 10*3/uL (ref 0–0.1)
BASOS PCT: 0 %
Eosinophils Absolute: 0 10*3/uL (ref 0–0.7)
Eosinophils Relative: 0 %
HEMATOCRIT: 34.7 % — AB (ref 35.0–47.0)
HEMOGLOBIN: 12.1 g/dL (ref 12.0–16.0)
Lymphocytes Relative: 15 %
Lymphs Abs: 1.8 10*3/uL (ref 1.0–3.6)
MCH: 31.6 pg (ref 26.0–34.0)
MCHC: 34.8 g/dL (ref 32.0–36.0)
MCV: 90.9 fL (ref 80.0–100.0)
MONO ABS: 0.7 10*3/uL (ref 0.2–0.9)
Monocytes Relative: 6 %
NEUTROS ABS: 9.3 10*3/uL — AB (ref 1.4–6.5)
NEUTROS PCT: 79 %
Platelets: 201 10*3/uL (ref 150–440)
RBC: 3.82 MIL/uL (ref 3.80–5.20)
RDW: 14.2 % (ref 11.5–14.5)
WBC: 11.8 10*3/uL — AB (ref 3.6–11.0)

## 2015-09-24 LAB — WET PREP, GENITAL
Sperm: NONE SEEN
Trich, Wet Prep: NONE SEEN
Yeast Wet Prep HPF POC: NONE SEEN

## 2015-09-24 LAB — CHLAMYDIA/NGC RT PCR (ARMC ONLY)
CHLAMYDIA TR: DETECTED — AB
N GONORRHOEAE: NOT DETECTED

## 2015-09-24 LAB — HCG, QUANTITATIVE, PREGNANCY: hCG, Beta Chain, Quant, S: 71738 m[IU]/mL — ABNORMAL HIGH (ref <5)

## 2015-09-24 MED ORDER — CEPHALEXIN 500 MG PO CAPS
500.0000 mg | ORAL_CAPSULE | Freq: Once | ORAL | Status: AC
  Administered 2015-09-24: 500 mg via ORAL
  Filled 2015-09-24: qty 1

## 2015-09-24 MED ORDER — CEPHALEXIN 500 MG PO CAPS: 500.0000 mg | ORAL_CAPSULE | Freq: Two times a day (BID) | ORAL | Status: DC

## 2015-09-24 MED ORDER — AZITHROMYCIN 500 MG PO TABS
1000.0000 mg | ORAL_TABLET | Freq: Once | ORAL | Status: AC
  Administered 2015-09-24: 1000 mg via ORAL
  Filled 2015-09-24: qty 2

## 2015-09-24 MED ORDER — ACETAMINOPHEN 325 MG PO TABS
650.0000 mg | ORAL_TABLET | Freq: Once | ORAL | Status: AC
  Administered 2015-09-24: 650 mg via ORAL
  Filled 2015-09-24: qty 2

## 2015-09-24 NOTE — Discharge Instructions (Signed)
You were evaluated for pelvic pressure and back pain and your ultrasound and evaluation are reassuring.  As we discussed, although your symptoms could be indicators of potential miscarriage, at the present time there is no evidence of miscarriage.  We discussed your vaginal swabs to suspect bacterial vaginitis and since your OB/GYN doctor had postponed treatment, I am going to leave the decision for treatment to your maternal-fetal medicine OB/GYN.  Keep your appointment for next week, you may call tomorrow to make sure your physician does not want you to make an appointment for earlier this week.  Return to the emergency department for any vaginal bleeding, new or worsening abdominal pain or pressure, vaginal fluid leakage, fever, or any other symptoms concerning to you.   Abdominal Pain During Pregnancy Abdominal pain is common in pregnancy. Most of the time, it does not cause harm. There are many causes of abdominal pain. Some causes are more serious than others. Some of the causes of abdominal pain in pregnancy are easily diagnosed. Occasionally, the diagnosis takes time to understand. Other times, the cause is not determined. Abdominal pain can be a sign that something is very wrong with the pregnancy, or the pain may have nothing to do with the pregnancy at all. For this reason, always tell your health care provider if you have any abdominal discomfort. HOME CARE INSTRUCTIONS  Monitor your abdominal pain for any changes. The following actions may help to alleviate any discomfort you are experiencing:  Do not have sexual intercourse or put anything in your vagina until your symptoms go away completely.  Get plenty of rest until your pain improves.  Drink clear fluids if you feel nauseous. Avoid solid food as long as you are uncomfortable or nauseous.  Only take over-the-counter or prescription medicine as directed by your health care provider.  Keep all follow-up appointments with your  health care provider. SEEK IMMEDIATE MEDICAL CARE IF:  You are bleeding, leaking fluid, or passing tissue from the vagina.  You have increasing pain or cramping.  You have persistent vomiting.  You have painful or bloody urination.  You have a fever.  You notice a decrease in your baby's movements.  You have extreme weakness or feel faint.  You have shortness of breath, with or without abdominal pain.  You develop a severe headache with abdominal pain.  You have abnormal vaginal discharge with abdominal pain.  You have persistent diarrhea.  You have abdominal pain that continues even after rest, or gets worse. MAKE SURE YOU:   Understand these instructions.  Will watch your condition.  Will get help right away if you are not doing well or get worse.   This information is not intended to replace advice given to you by your health care provider. Make sure you discuss any questions you have with your health care provider.   Document Released: 02/24/2005 Document Revised: 12/15/2012 Document Reviewed: 09/23/2012 Elsevier Interactive Patient Education 2016 Elsevier Inc.  Bacterial Vaginosis Bacterial vaginosis is an infection of the vagina. It happens when too many germs (bacteria) grow in the vagina. Having this infection puts you at risk for getting other infections from sex. Treating this infection can help lower your risk for other infections, such as:   Chlamydia.  Gonorrhea.  HIV.  Herpes. HOME CARE  Take your medicine as told by your doctor.  Finish your medicine even if you start to feel better.  Tell your sex partner that you have an infection. They should see their doctor  for treatment.  During treatment:  Avoid sex or use condoms correctly.  Do not douche.  Do not drink alcohol unless your doctor tells you it is ok.  Do not breastfeed unless your doctor tells you it is ok. GET HELP IF:  You are not getting better after 3 days of  treatment.  You have more grey fluid (discharge) coming from your vagina than before.  You have more pain than before.  You have a fever. MAKE SURE YOU:   Understand these instructions.  Will watch your condition.  Will get help right away if you are not doing well or get worse.   This information is not intended to replace advice given to you by your health care provider. Make sure you discuss any questions you have with your health care provider.   Document Released: 12/04/2007 Document Revised: 03/17/2014 Document Reviewed: 10/06/2012 Elsevier Interactive Patient Education 2016 [employer]. Threatened Miscarriage A threatened miscarriage occurs when you have vaginal bleeding during your first 20 weeks of pregnancy but the pregnancy has not ended. If you have vaginal bleeding during this time, your health care provider will do tests to make sure you are still pregnant. If the tests show you are still pregnant and the developing baby (fetus) inside your womb (uterus) is still growing, your condition is considered a threatened miscarriage. A threatened miscarriage does not mean your pregnancy will end, but it does increase the risk of losing your pregnancy (complete miscarriage). CAUSES  The cause of a threatened miscarriage is usually not known. If you go on to have a complete miscarriage, the most common cause is an abnormal number of chromosomes in the developing baby. Chromosomes are the structures inside cells that hold all your genetic material. Some causes of vaginal bleeding that do not result in miscarriage include:  Having sex.  Having an infection.  Normal hormone changes of pregnancy.  Bleeding that occurs when an egg implants in your uterus. RISK FACTORS Risk factors for bleeding in early pregnancy include:  Obesity.  Smoking.  Drinking excessive amounts of alcohol or caffeine.  Recreational drug use. SIGNS AND SYMPTOMS  Light vaginal bleeding.  Mild  abdominal pain or cramps. DIAGNOSIS  If you have bleeding with or without abdominal pain before 20 weeks of pregnancy, your health care provider will do tests to check whether you are still pregnant. One important test involves using sound waves and a computer (ultrasound) to create images of the inside of your uterus. Other tests include an internal exam of your vagina and uterus (pelvic exam) and measurement of your baby's heart rate.  You may be diagnosed with a threatened miscarriage if:  Ultrasound testing shows you are still pregnant.  Your baby's heart rate is strong.  A pelvic exam shows that the opening between your uterus and your vagina (cervix) is closed.  Your heart rate and blood pressure are stable.  Blood tests confirm you are still pregnant. TREATMENT  No treatments have been shown to prevent a threatened miscarriage from going on to a complete miscarriage. However, the right home care is important.  HOME CARE INSTRUCTIONS   Make sure you keep all your appointments for prenatal care. This is very important.  Get plenty of rest.  Do not have sex or use tampons if you have vaginal bleeding.  Do not douche.  Do not smoke or use recreational drugs.  Do not drink alcohol.  Avoid caffeine. SEEK MEDICAL CARE IF:  You have light vaginal bleeding or  spotting while pregnant.  You have abdominal pain or cramping.  You have a fever. SEEK IMMEDIATE MEDICAL CARE IF:  You have heavy vaginal bleeding.  You have blood clots coming from your vagina.  You have severe low back pain or abdominal cramps.  You have fever, chills, and severe abdominal pain. MAKE SURE YOU:  Understand these instructions.  Will watch your condition.  Will get help right away if you are not doing well or get worse.   This information is not intended to replace advice given to you by your health care provider. Make sure you discuss any questions you have with your health care provider.    Document Released: 02/24/2005 Document Revised: 03/01/2013 Document Reviewed: 12/21/2012 Elsevier Interactive Patient Education 2016 Elsevier Inc.    Chlamydia, Female Chlamydia is an infection. It is spread through sexual contact. Chlamydia can be in different areas of the body. These areas include the cervix, urethra, throat, or rectum. You may not know you have chlamydia because many people never develop the symptoms. Chlamydia is not difficult to treat once you know you have it. However, if it is left untreated, chlamydia can lead to more serious health problems.  CAUSES  Chlamydia is caused by bacteria. It is a sexually transmitted disease. It is passed from an infected partner during intimate contact. This contact could be with the genitals, mouth, or rectal area. Chlamydia can also be passed from mothers to babies during birth. SIGNS AND SYMPTOMS  There may not be any symptoms. This is often the case early in the infection. If symptoms develop, they may include:  Mild pain and discomfort when urinating.  Redness, soreness, and swelling (inflammation) of the rectum.  Vaginal discharge.  Painful intercourse.  Abdominal pain.  Bleeding between menstrual periods. DIAGNOSIS  To diagnose this infection, your health care provider will do a pelvic exam. Cultures will be taken of the vagina, cervix, urine, and possibly the rectum to verify the diagnosis.  TREATMENT You will be given antibiotic medicines. If you are pregnant, certain types of antibiotics will need to be avoided. Any sexual partners should also be treated, even if they do not show symptoms. Your health care provider may test you for infection again 3 months after treatment. HOME CARE INSTRUCTIONS   Take your antibiotic medicine as directed by your health care provider. Finish the antibiotic even if you start to feel better.  Take medicines only as directed by your health care provider.  Inform any sexual partners  about the infection. They should also be treated.  Do not have sexual contact until your health care provider tells you it is okay.  Get plenty of rest.  Eat a well-balanced diet.  Drink enough fluids to keep your urine clear or pale yellow.  Keep all follow-up visits as directed by your health care provider. SEEK MEDICAL CARE IF:  You have painful urination.  You have abdominal pain.  You have vaginal discharge.  You have painful sexual intercourse.  You have bleeding between periods and after sex.  You have a fever. SEEK IMMEDIATE MEDICAL CARE IF:   You experience nausea or vomiting.  You experience excessive sweating (diaphoresis).  You have difficulty swallowing.   This information is not intended to replace advice given to you by your health care provider. Make sure you discuss any questions you have with your health care provider.   Document Released: 12/04/2004 Document Revised: 11/15/2014 Document Reviewed: 11/01/2012 Elsevier Interactive Patient Education 2016 [employer].  Pregnancy  and Urinary Tract Infection A urinary tract infection (UTI) is a bacterial infection of the urinary tract. Infection of the urinary tract can include the ureters, kidneys (pyelonephritis), bladder (cystitis), and urethra (urethritis). All pregnant women should be screened for bacteria in the urinary tract. Identifying and treating a UTI will decrease the risk of preterm labor and developing more serious infections in both the mother and baby. CAUSES Bacteria germs cause almost all UTIs.  RISK FACTORS Many factors can increase your chances of getting a UTI during pregnancy. These include:  Having a short urethra.  Poor toilet and hygiene habits.  Sexual intercourse.  Blockage of urine along the urinary tract.  Problems with the pelvic muscles or nerves.  Diabetes.  Obesity.  Bladder problems after having several children.  Previous history of UTI. SIGNS AND SYMPTOMS    Pain, burning, or a stinging feeling when urinating.  Suddenly feeling the need to urinate right away (urgency).  Loss of bladder control (urinary incontinence).  Frequent urination, more than is common with pregnancy.  Lower abdominal or back discomfort.  Cloudy urine.  Blood in the urine (hematuria).  Fever. When the kidneys are infected, the symptoms may be:  Back pain.  Flank pain on the right side more so than the left.  Fever.  Chills.  Nausea.  Vomiting. DIAGNOSIS  A urinary tract infection is usually diagnosed through urine tests. Additional tests and procedures are sometimes done. These may include:  Ultrasound exam of the kidneys, ureters, bladder, and urethra.  Looking in the bladder with a lighted tube (cystoscopy). TREATMENT Typically, UTIs can be treated with antibiotic medicines.  HOME CARE INSTRUCTIONS   Only take over-the-counter or prescription medicines as directed by your health care provider. If you were prescribed antibiotics, take them as directed. Finish them even if you start to feel better.  Drink enough fluids to keep your urine clear or pale yellow.  Do not have sexual intercourse until the infection is gone and your health care provider says it is okay.  Make sure you are tested for UTIs throughout your pregnancy. These infections often come back. Preventing a UTI in the Future  Practice good toilet habits. Always wipe from front to back. Use the tissue only once.  Do not hold your urine. Empty your bladder as soon as possible when the urge comes.  Do not douche or use deodorant sprays.  Wash with soap and warm water around the genital area and the anus.  Empty your bladder before and after sexual intercourse.  Wear underwear with a cotton crotch.  Avoid caffeine and carbonated drinks. They can irritate the bladder.  Drink cranberry juice or take cranberry pills. This may decrease the risk of getting a UTI.  Do not  drink alcohol.  Keep all your appointments and tests as scheduled. SEEK MEDICAL CARE IF:   Your symptoms get worse.  You are still having fevers 2 or more days after treatment begins.  You have a rash.  You feel that you are having problems with medicines prescribed.  You have abnormal vaginal discharge. SEEK IMMEDIATE MEDICAL CARE IF:   You have back or flank pain.  You have chills.  You have blood in your urine.  You have nausea and vomiting.  You have contractions of your uterus.  You have a gush of fluid from the vagina. MAKE SURE YOU:  Understand these instructions.   Will watch your condition.   Will get help right away if you are not doing  well or get worse.    This information is not intended to replace advice given to you by your health care provider. Make sure you discuss any questions you have with your health care provider.   Document Released: 06/21/2010 Document Revised: 12/15/2012 Document Reviewed: 09/23/2012 Elsevier Interactive Patient Education [employer]2016 Elsevier Inc.

## 2015-09-24 NOTE — ED Notes (Signed)
Pt informed that urine need it for UA, states unable now but will try again after drinking water.

## 2015-09-24 NOTE — ED Notes (Signed)
Pt reports vaginal pressure for the past 30 minutes PTA; pt reports high risk pregnancy, reports hx of twin birth at 23 weeks. Pt denies vaginal bleeding. Pt reports OBGYN is [facility].

## 2015-09-24 NOTE — ED Provider Notes (Signed)
**Note Ann-Identified via Obfuscation** [facility] Emergency Department Provider Note   ____________________________________________  Time seen: Approximately 5:30 PM I have reviewed the triage vital signs and the triage nursing note.  HISTORY  Chief Complaint Vaginal Pain   Historian Patient  HPI Ann Padilla is a 25 y.o. female who is G4 with a history of a preterm delivery of twins at 1023 weeks which died, history of preterm delivery of twins at 36 weeks on cerclage?, and a singleton delivery at 5538 weeks? -- Here currently reporting to be about [redacted] weeks pregnant with a singleton, [facility] history of preterm deliveries.  Patient states that just prior to arrival, for the last hour or so she has had vaginal pressure and low back pain which is how she felt when she went into preterm labor.  Patient states that she was supposed to start a "shot" next week with maternal-fetal medicine.  Denies urinary symptoms. Denies fevers. Denies abdominal pain or pressure.    History reviewed. No pertinent past medical history.  There are no active problems to display for this patient.   Past Surgical History  Procedure Laterality Date  . Cesarean section      Current Outpatient Rx  Name  Route  Sig  Dispense  Refill  . cephALEXin (KEFLEX) 500 MG capsule   Oral   Take 1 capsule (500 mg total) by mouth 2 (two) times daily.   14 capsule   0   . docusate sodium (COLACE) 100 MG capsule   Oral   Take 1 capsule (100 mg total) by mouth daily as needed.   10 capsule   0   . Multiple Vitamin (MULTIVITAMIN WITH MINERALS) TABS tablet   Oral   Take 1 tablet by mouth daily.         . ondansetron (ZOFRAN ODT) 8 MG disintegrating tablet   Oral   Take 1 tablet (8 mg total) by mouth every 8 (eight) hours as needed for nausea or vomiting.   20 tablet   0     Allergies Review of patient's allergies indicates no known allergies.  No family history on  file.  Social History Social History  Substance Use Topics  . Smoking status: Never Smoker   . Smokeless tobacco: None  . Alcohol Use: No    Review of Systems  Constitutional: Negative for fever. Eyes: Negative for visual changes. ENT: Negative for sore throat. Cardiovascular: Negative for chest pain. Respiratory: Negative for shortness of breath. Gastrointestinal: Negative for abdominal pain, vomiting and diarrhea. Genitourinary: Negative for dysuria. Musculoskeletal: Negative for back pain. Skin: Negative for rash. Neurological: Negative for headache. 10 point Review of Systems otherwise negative ____________________________________________   PHYSICAL EXAM:  VITAL SIGNS: ED Triage Vitals  Enc Vitals Group     BP 09/24/15 1607 116/75 mmHg     Pulse Rate 09/24/15 1607 99     Resp 09/24/15 1607 16     Temp 09/24/15 1607 98.2 F (36.8 C)     Temp Source 09/24/15 1607 Oral     SpO2 09/24/15 1607 100 %     Weight 09/24/15 1607 110 lb (49.896 kg)     Height 09/24/15 1607 5\' 2"  (1.575 m)     Head Cir --      Peak Flow --      Pain Score 09/24/15 1607 6     Pain Loc --      Pain Edu? --  Excl. in GC? --      Constitutional: Alert and oriented. Tearful and upset. No acute distress. HEENT   Head: Normocephalic and atraumatic.      Eyes: Conjunctivae are normal. PERRL. Normal extraocular movements.      Ears:         Nose: No congestion/rhinnorhea.   Mouth/Throat: Mucous membranes are moist.   Neck: No stridor. Cardiovascular/Chest: Normal rate, regular rhythm.  No murmurs, rubs, or gallops. Respiratory: Normal respiratory effort without tachypnea nor retractions. Breath sounds are clear and equal bilaterally. No wheezes/rales/rhonchi. Gastrointestinal: Soft. No distention, no guarding, no rebound. Nontender to palpation. Unable to palpate the fundus, may be low pelvis. Genitourinary/rectal: White discharge. Closed cervix. No bulging membranes. No  blood. Musculoskeletal: Nontender with normal range of motion in all extremities. No joint effusions.  No lower extremity tenderness.  No edema. Neurologic:  Normal speech and language. No gross or focal neurologic deficits are appreciated. Skin:  Skin is warm, dry and intact. No rash noted. Psychiatric: Mood and affect are normal. Speech and behavior are normal. Patient exhibits appropriate insight and judgment.  ____________________________________________   EKG I, Governor Rooks, MD, the attending physician have personally viewed and interpreted all ECGs.  None ____________________________________________  LABS (pertinent positives/negatives)  Labs Reviewed  CHLAMYDIA/NGC RT PCR ([facility] ONLY) - Abnormal; Notable for the following:    Chlamydia Tr DETECTED (*)    All other components within normal limits  WET PREP, GENITAL - Abnormal; Notable for the following:    Clue Cells Wet Prep HPF POC PRESENT (*)    WBC, Wet Prep HPF POC RARE (*)    All other components within normal limits  HCG, QUANTITATIVE, PREGNANCY - Abnormal; Notable for the following:    hCG, Beta Chain, Quant, [place] [address] (*)    All other components within normal limits  URINALYSIS COMPLETEWITH MICROSCOPIC ([facility] ONLY) - Abnormal; Notable for the following:    Color, Urine YELLOW (*)    APPearance CLOUDY (*)    Ketones, ur 2+ (*)    Hgb urine dipstick 2+ (*)    Leukocytes, UA 3+ (*)    Bacteria, UA RARE (*)    Squamous Epithelial / LPF 0-5 (*)    All other components within normal limits  CBC WITH DIFFERENTIAL/PLATELET - Abnormal; Notable for the following:    WBC 11.8 (*)    HCT 34.7 (*)    Neutro Abs 9.3 (*)    All other components within normal limits  URINE CULTURE    ____________________________________________  RADIOLOGY All Xrays were viewed by me. Imaging interpreted by Radiologist.  Ultrasound pelvic and transvaginal:   IMPRESSION: Single live intrauterine pregnancy estimated gestational  age [redacted] weeks 4 days for estimated date of delivery 03/20/2016. No apparent complications.  This exam is performed on an emergent basis and does not comprehensively evaluate fetal size, dating, or anatomy; follow-up complete OB US should be considered if further fetal assessment is warranted.   Electronically Signed By: Rubye Oaks M.D. __________________________________________  PROCEDURES  Procedure(s) performed: None  Critical Care performed: None  ____________________________________________   ED COURSE / ASSESSMENT AND PLAN  Pertinent labs & imaging results that were available during my care of the patient were reviewed by me and considered in my medical decision making (see chart for details).   I was able to read the OB/GYN and note from June, which discussed due to her history of preterm labors she was going to start a medication possibly at 17 weeks to  help prevent preterm labor. It also notes that they had discussed cerclage and chose not to at this point in time. There is a note that potentially 18 weeks ultrasounds will be performed to ensure cervical competence.  Today she's having pelvic pressure similar to when she had preterm labor.  I will send her for an ultrasound to evaluate viability at this point time. Pelvic exam shows no evidence of bulging membranes or ruptured membranes or bleeding.   Urinalysis consistent with urinary tract infection. She does have a minimally elevated white blood cell count, but she is comfortable symptomatically from an perspective and no flank pain. She has no fever and no nausea or vomiting. I think she is okay for outpatient management. I have a low suspicion that she's having pyelonephritis.  We discussed immediate return to emergency department for fever, dizziness, chills, back pain or flank pain, inability to urinate or, abdominal pain.  She does have Chlamydia and was given 1 g of azithromycin for this. She was  discussed to have partners tested and treated. She is pectoral vaginosis, and as she was told to hold off on treatment with her OB/GYN, I will defer treatment/management to her OB/GYN.  I spoke with the on-call OB at [facility] who recommended no additional emergency treatment in terms of the pelvic pressure and her history of preterm labor given the closed cervix and reassuring ultrasound.  She has an appointment for next Tuesday on 10/02/15. The OB resident did not indicate that the patient would receive any sort of emergency treatment or emergency cerclage at this point, and recommends outpatient follow up.    CONSULTATIONS:   Maternal fetal medicine at [facility]: Dr. Harle Stanford (Resident Ulis Rias?) -- recommends    Patient / Family / Caregiver informed of clinical course, medical decision-making process, and agree with plan.   I discussed return precautions, follow-up instructions, and discharged instructions with patient and/or family.   ___________________________________________   FINAL CLINICAL IMPRESSION(S) / ED DIAGNOSES   Final diagnoses:  Bacterial vaginosis  Pelvic pressure in pregnancy, antepartum, second trimester  UTI (urinary tract infection) in pregnancy, antepartum, second trimester  Chlamydia infection, current pregnancy, second trimester              Note: This dictation was prepared with Dragon dictation. Any transcriptional errors that result from this process are unintentional   Governor Rooks, MD 09/24/15 2056

## 2015-09-24 NOTE — ED Notes (Signed)
Pt provided graham crackers and peanut butter, ok per Dr. Shaune PollackLord

## 2015-09-26 LAB — URINE CULTURE

## 2016-04-26 DIAGNOSIS — Z79899 Other long term (current) drug therapy: Secondary | ICD-10-CM | POA: Insufficient documentation

## 2016-04-26 DIAGNOSIS — K0889 Other specified disorders of teeth and supporting structures: Secondary | ICD-10-CM | POA: Insufficient documentation

## 2016-04-26 NOTE — ED Notes (Signed)
Pt ambulated to stat registration, [facility], and had to write her birth date on a piece of paper because her mouth hurts too much to speak;

## 2016-04-27 ENCOUNTER — Encounter: Payer: Self-pay | Admitting: Emergency Medicine

## 2016-04-27 ENCOUNTER — Emergency Department
Admission: EM | Admit: 2016-04-27 | Discharge: 2016-04-27 | Disposition: A | Discharge status: Home / Self Care | Payer: Medicaid Other | Attending: Emergency Medicine | Admitting: Emergency Medicine

## 2016-04-27 DIAGNOSIS — K0889 Other specified disorders of teeth and supporting structures: Secondary | ICD-10-CM

## 2016-04-27 MED ORDER — KETOROLAC TROMETHAMINE 60 MG/2ML IM SOLN
15.0000 mg | INTRAMUSCULAR | Status: AC
  Administered 2016-04-27: 15 mg via INTRAMUSCULAR

## 2016-04-27 MED ORDER — KETOROLAC TROMETHAMINE 30 MG/ML IJ SOLN
INTRAMUSCULAR | Status: AC
  Administered 2016-04-27: 15 mg via INTRAMUSCULAR
  Filled 2016-04-27: qty 1

## 2016-04-27 MED ORDER — PENICILLIN V POTASSIUM 500 MG PO TABS: 500.0000 mg | ORAL_TABLET | Freq: Four times a day (QID) | ORAL | 0 refills | Status: DC

## 2016-04-27 NOTE — ED Triage Notes (Signed)
Pt states that she has been having upper right tooth pain since Saturday morning. Pt arrives ambulatory to triage with [employer] bag. Pt reports that she has been attempting to control the pain with Orajel. Pt is in in NAD at this time.

## 2016-04-27 NOTE — Discharge Instructions (Signed)

## 2016-04-27 NOTE — ED Provider Notes (Signed)
[facility] Emergency Department Provider Note  ____________________________________________   First MD Initiated Contact with Patient 04/27/16 0136     (approximate)  I have reviewed the triage vital signs and the nursing notes.   HISTORY  Chief Complaint Dental Pain    HPI Ann Padilla is a 26 y.o. female with no significant medical history who presents for evaluation of dental pain.  She states that her tooth or teeth in the right upper back part of her mouth has been aching for months but it became acutely worse yesterday.  She does not think anything of particular makes it better or worse, it just aches sometimes.  She describes it as severe.  Orajel has not been helping.  Of note she arrived to triage with a bag of [employer] and was able to eat it with no difficulty which I confirmed based on the amount of food stuck in her teeth when I performed the exam.  She states she does not have a sore throat, no difficulty swallowing, no fever/chills, chest pain, shortness of breath, nausea, vomiting, nor abdominal pain. She has seen a dentist years ago but not recently.   History reviewed. No pertinent past medical history.  There are no active problems to display for this patient.   Past Surgical History:  Procedure Laterality Date  . CESAREAN SECTION      Prior to Admission medications   Medication Sig Start Date End Date Taking? Authorizing Provider  cephALEXin (KEFLEX) 500 MG capsule Take 1 capsule (500 mg total) by mouth 2 (two) times daily. 09/24/15   Governor Rooks, MD  Multiple Vitamin (MULTIVITAMIN WITH MINERALS) TABS tablet Take 1 tablet by mouth daily.    Historical Provider, MD  ondansetron (ZOFRAN ODT) 8 MG disintegrating tablet Take 1 tablet (8 mg total) by mouth every 8 (eight) hours as needed for nausea or vomiting. 11/27/14   Joni Reining, PA-C  penicillin v potassium (VEETID) 500 MG tablet Take 1 tablet (500 mg total) by mouth 4 (four)  times daily. 04/27/16   Loleta Rose, MD    Allergies Patient has no known allergies.  No family history on file.  Social History Social History  Substance Use Topics  . Smoking status: Never Smoker  . Smokeless tobacco: Never Used  . Alcohol use No    Review of Systems Constitutional: No fever/chills Eyes: No visual changes. ENT: No sore throat.  +right upper dental pain Cardiovascular: Denies chest pain. Respiratory: Denies shortness of breath. Gastrointestinal: No abdominal pain.  No nausea, no vomiting.  No diarrhea.  No constipation.  ____________________________________________   PHYSICAL EXAM:  VITAL SIGNS: ED Triage Vitals  Enc Vitals Group     BP 04/27/16 0005 109/76     Pulse Rate 04/27/16 0005 78     Resp 04/27/16 0005 18     Temp 04/27/16 0005 98.7 F (37.1 C)     Temp Source 04/27/16 0005 Oral     SpO2 04/27/16 0005 98 %     Weight 04/27/16 0006 134 lb (60.8 kg)     Height 04/27/16 0006 5\' 2"  (1.575 m)     Head Circumference --      Peak Flow --      Pain Score 04/27/16 0006 10     Pain Loc --      Pain Edu? --      Excl. in GC? --     Constitutional: Alert and oriented. Well appearing and in no acute distress.  Sleeping comfortably initially, then awoke easily to light voice Eyes: Conjunctivae are normal. PERRL. EOMI. Head: Atraumatic. Nose: No congestion/rhinnorhea. Mouth/Throat: Mucous membranes are moist.  Oropharynx non-erythematous. No obvious abscesses nor dental caries.  Slightly tenderness to palpation of teeth #1 and #2 (upper right molars).  No evidence of peritonsillar abscess, Ludwig's, or other acute/emergent condition. Neck: No stridor.  No meningeal signs.   Cardiovascular: Normal rate, regular rhythm. Good peripheral circulation.  Respiratory: Normal respiratory effort.  No retractions.  Gastrointestinal: Soft and nontender. No distention.  Skin:  Skin is warm, dry and intact. No rash noted. Psychiatric: Mood and affect are  normal. Speech and behavior are normal.  ____________________________________________   LABS (all labs ordered are listed, but only abnormal results are displayed)  Labs Reviewed - No data to display ____________________________________________  EKG  None - EKG not ordered by ED physician ____________________________________________  RADIOLOGY   No results found.  ____________________________________________   PROCEDURES  Procedure(s) performed:   Procedures   Critical Care performed: No ____________________________________________   INITIAL IMPRESSION / ASSESSMENT AND PLAN / ED COURSE  Pertinent labs & imaging results that were available during my care of the patient were reviewed by me and considered in my medical decision making (see chart for details).  No evidence of acute or emergent infection.  I explained to the patient that we do not give narcotics for dental pain.  Given the possibility that she may have a very mild superficial infection that would preclude definitive dental work, I will give a course of penicillin and an intramuscular injection of Toradol.  I encouraged her to use over-the-counter ibuprofen and Tylenol and follow up as soon as possible.  She has for a work note and I explained I can provide a note that states that she was in the emergency department tonight but that she does not need a note off from work for an extended period of time and she seemed surprised by this.   I gave my usual and customary return precautions.  I reviewed the patient's prescription history over the last 12 months in the [place] Controlled Substances Database, and she has only one prescription during that time which was filled about 2 months ago for a quantity of 20 oxycodone by a provider in [place].  I believe that this corresponds to when her baby was born.   Clinical Course as of Apr 27 157  Wynelle LinkSun Apr 27, 2016  0158 Of note, she stated there is no chance she could be  pregnant (just had a baby 2 months ago), and that she is not breastfeeding, so no concerns about the Toradol nor the penicillin.  [CF]    Clinical Course User Index [CF] Loleta Roseory Blyss Lugar, MD    ____________________________________________  FINAL CLINICAL IMPRESSION(S) / ED DIAGNOSES  Final diagnoses:  Pain, dental     MEDICATIONS GIVEN DURING THIS VISIT:  Medications  ketorolac (TORADOL) injection 15 mg (not administered)     NEW OUTPATIENT MEDICATIONS STARTED DURING THIS VISIT:  New Prescriptions   PENICILLIN V POTASSIUM (VEETID) 500 MG TABLET    Take 1 tablet (500 mg total) by mouth 4 (four) times daily.    Modified Medications   No medications on file    Discontinued Medications   No medications on file     Note:  This document was prepared using Dragon voice recognition software and may include unintentional dictation errors.    Loleta Roseory Gregg Holster, MD 04/27/16 [phone]

## 2016-05-27 ENCOUNTER — Encounter: Payer: Self-pay | Admitting: Emergency Medicine

## 2016-05-27 ENCOUNTER — Emergency Department
Admission: EM | Admit: 2016-05-27 | Discharge: 2016-05-27 | Disposition: A | Discharge status: Home / Self Care | Payer: Medicaid Other | Attending: Emergency Medicine | Admitting: Emergency Medicine

## 2016-05-27 DIAGNOSIS — F17290 Nicotine dependence, other tobacco product, uncomplicated: Secondary | ICD-10-CM | POA: Insufficient documentation

## 2016-05-27 DIAGNOSIS — Z79899 Other long term (current) drug therapy: Secondary | ICD-10-CM | POA: Insufficient documentation

## 2016-05-27 DIAGNOSIS — K0889 Other specified disorders of teeth and supporting structures: Secondary | ICD-10-CM | POA: Diagnosis present

## 2016-05-27 MED ORDER — AMOXICILLIN 500 MG PO CAPS: 500.0000 mg | ORAL_CAPSULE | Freq: Three times a day (TID) | ORAL | 0 refills | Status: AC

## 2016-05-27 MED ORDER — KETOROLAC TROMETHAMINE 30 MG/ML IJ SOLN
30.0000 mg | Freq: Once | INTRAMUSCULAR | Status: AC
  Administered 2016-05-27: 30 mg via INTRAMUSCULAR
  Filled 2016-05-27: qty 1

## 2016-05-27 MED ORDER — KETOROLAC TROMETHAMINE 10 MG PO TABS: 10.0000 mg | ORAL_TABLET | Freq: Four times a day (QID) | ORAL | 0 refills | Status: AC | PRN

## 2016-05-27 NOTE — ED Triage Notes (Addendum)
Pt presents to ED with right sided tooth pain. Pt states she was seen in this ED for the same about two months ago but it seemed to improve until yesterday. Denies fever or drainage. appt for too th to be pulled next week.

## 2016-05-28 NOTE — ED Provider Notes (Signed)
[facility] Emergency Department Provider Note  ____________________________________________  Time seen: Approximately 3:45 PM  I have reviewed the triage vital signs and the nursing notes.   HISTORY  Chief Complaint Dental Pain    HPI Ann Padilla is a 26 y.o. female presenting to the emergency department with right lower jaw pain. Patient states that she has a "broken tooth" along the distribution of inferior 29. Patient states that she has an appointment at a local dentist this week. However, she presents to the emergency department because her dental pain is worsening. Patient currently rates her dental pain at 8 out of 10 in intensity. Patient has noticed no changes in gingival hypertrophy or edema the right lower jaw. No streaking visualized. Patient has been afebrile. Patient has taken ibuprofen but has attempted no other alleviating measures.   History reviewed. No pertinent past medical history.  There are no active problems to display for this patient.   Past Surgical History:  Procedure Laterality Date  . CESAREAN SECTION      Prior to Admission medications   Medication Sig Start Date End Date Taking? Authorizing Provider  amoxicillin (AMOXIL) 500 MG capsule Take 1 capsule (500 mg total) by mouth 3 (three) times daily. 05/27/16 06/06/16  Orvil Feil, PA-C  cephALEXin (KEFLEX) 500 MG capsule Take 1 capsule (500 mg total) by mouth 2 (two) times daily. 09/24/15   Governor Rooks, MD  ketorolac (TORADOL) 10 MG tablet Take 1 tablet (10 mg total) by mouth every 6 (six) hours as needed. 05/27/16 06/01/16  Orvil Feil, PA-C  Multiple Vitamin (MULTIVITAMIN WITH MINERALS) TABS tablet Take 1 tablet by mouth daily.    Historical Provider, MD  ondansetron (ZOFRAN ODT) 8 MG disintegrating tablet Take 1 tablet (8 mg total) by mouth every 8 (eight) hours as needed for nausea or vomiting. 11/27/14   Joni Reining, PA-C  penicillin v potassium (VEETID) 500 MG  tablet Take 1 tablet (500 mg total) by mouth 4 (four) times daily. 04/27/16   Loleta Rose, MD    Allergies Patient has no known allergies.  No family history on file.  Social History Social History  Substance Use Topics  . Smoking status: Current Every Day Smoker    Types: Cigars  . Smokeless tobacco: Never Used  . Alcohol use No     Review of Systems  Constitutional: No fever/chills Eyes: No visual changes. No discharge ENT: Patient has dental pain.  Cardiovascular: no chest pain. Respiratory: no cough. No SOB. Gastrointestinal: No abdominal pain.  No nausea, no vomiting.  No diarrhea.  No constipation. Musculoskeletal: Negative for musculoskeletal pain. Skin: Negative for rash, abrasions, lacerations, ecchymosis. Neurological: Negative for headaches, focal weakness or numbness.  ____________________________________________   PHYSICAL EXAM:  VITAL SIGNS: ED Triage Vitals  Enc Vitals Group     BP 05/27/16 2133 131/83     Pulse Rate 05/27/16 2133 91     Resp 05/27/16 2133 20     Temp 05/27/16 2133 98.6 F (37 C)     Temp Source 05/27/16 2133 Oral     SpO2 05/27/16 2133 97 %     Weight 05/27/16 2133 134 lb (60.8 kg)     Height 05/27/16 2133 5\' 2"  (1.575 m)     Head Circumference --      Peak Flow --      Pain Score 05/27/16 2134 10     Pain Loc --      Pain Edu? --  Excl. in GC? --     Constitutional: Alert and oriented. Well appearing and in no acute distress. Eyes: Conjunctivae are normal. PERRL. EOMI. Head: Atraumatic. ENT:      Ears: Tympanic membranes are pearly bilaterally.      Nose: No congestion/rhinnorhea.      Mouth/Throat: Patient states that she has dental pain at inferior 29. No gingival hypertrophy or right lower jaw edema visualized. Airway is patent. Uvula is midline. Neck: No stridor. Hematological/Lymphatic/Immunilogical: No cervical lymphadenopathy. Cardiovascular: Normal rate, regular rhythm. Normal S1 and S2.  Good peripheral  circulation. Respiratory: Normal respiratory effort without tachypnea or retractions. Lungs CTAB. Good air entry to the bases with no decreased or absent breath sounds. Musculoskeletal: Full range of motion to all extremities. No gross deformities appreciated. Neurologic:  Normal speech and language. No gross focal neurologic deficits are appreciated.  Skin:  Skin is warm, dry and intact. No rash noted. Psychiatric: Mood and affect are normal. Speech and behavior are normal. Patient exhibits appropriate insight and judgement.  ____________________________________________   LABS (all labs ordered are listed, but only abnormal results are displayed)  Labs Reviewed - No data to display ____________________________________________  EKG   ____________________________________________  RADIOLOGY   No results found.  ____________________________________________    PROCEDURES  Procedure(s) performed:    Procedures    Medications  ketorolac (TORADOL) 30 MG/ML injection 30 mg (30 mg Intramuscular Given 05/27/16 2236)     ____________________________________________   INITIAL IMPRESSION / ASSESSMENT AND PLAN / ED COURSE  Pertinent labs & imaging results that were available during my care of the patient were reviewed by me and considered in my medical decision making (see chart for details).  Review of the [place] CSRS was performed in accordance of the [facility] prior to dispensing any controlled drugs.     Assessment and Plan: Dental Pain  Patient presents to the emergency department with dental pain from inferior 29. Patient was given an injection of Toradol in the emergency department and discharged with oral Toradol. Patient was also discharged with amoxicillin. Patient education was provided regarding the importance of follow-up with the dentist. Patient voiced understanding. Vital signs are reassuring. All patient questions were  answered. ____________________________________________  FINAL CLINICAL IMPRESSION(S) / ED DIAGNOSES  Final diagnoses:  Pain, dental      NEW MEDICATIONS STARTED DURING THIS VISIT:  Discharge Medication List as of 05/27/2016 10:37 PM    START taking these medications   Details  amoxicillin (AMOXIL) 500 MG capsule Take 1 capsule (500 mg total) by mouth 3 (three) times daily., Starting Tue 05/27/2016, Until Fri 06/06/2016, Print    ketorolac (TORADOL) 10 MG tablet Take 1 tablet (10 mg total) by mouth every 6 (six) hours as needed., Starting Tue 05/27/2016, Until Sun 06/01/2016, Print            This chart was dictated using voice recognition software/Dragon. Despite best efforts to proofread, errors can occur which can change the meaning. Any change was purely unintentional.    Orvil FeilJaclyn M Menachem Urbanek, PA-C 05/28/16 1552    Myrna Blazeravid Matthew Schaevitz, MD 05/30/16 [phone]

## 2016-06-19 ENCOUNTER — Emergency Department
Admission: EM | Admit: 2016-06-19 | Discharge: 2016-06-19 | Disposition: A | Discharge status: Home / Self Care | Payer: Medicaid Other | Attending: Emergency Medicine | Admitting: Emergency Medicine

## 2016-06-19 DIAGNOSIS — F17290 Nicotine dependence, other tobacco product, uncomplicated: Secondary | ICD-10-CM | POA: Diagnosis not present

## 2016-06-19 DIAGNOSIS — N946 Dysmenorrhea, unspecified: Secondary | ICD-10-CM

## 2016-06-19 DIAGNOSIS — N939 Abnormal uterine and vaginal bleeding, unspecified: Secondary | ICD-10-CM

## 2016-06-19 LAB — POCT PREGNANCY, URINE: PREG TEST UR: NEGATIVE

## 2016-06-19 NOTE — ED Provider Notes (Signed)
[facility] Emergency Department Provider Note  ____________________________________________  Time seen: Approximately 3:30 PM  I have reviewed the triage vital signs and the nursing notes.   HISTORY  Chief Complaint Vaginal Bleeding    HPI Ann Padilla is a 26 y.o. female who complains of 2 days of slight vaginal bleeding with some clots as well as cramping pelvic pain. She was recently pregnant and gave birth in December. She then had a Depo-Provera injection in February, and so had not had a period since about a year ago. Denies chest pain shortness of breath fever chills dysuria frequency urgency or abnormal vaginal discharge. She is concerned that she could be pregnant or having a miscarriage.   History reviewed. No pertinent past medical history.   There are no active problems to display for this patient.    Past Surgical History:  Procedure Laterality Date  . CESAREAN SECTION       Prior to Admission medications   Medication Sig Start Date End Date Taking? Authorizing Provider  cephALEXin (KEFLEX) 500 MG capsule Take 1 capsule (500 mg total) by mouth 2 (two) times daily. 09/24/15   Governor Rooks, MD  Multiple Vitamin (MULTIVITAMIN WITH MINERALS) TABS tablet Take 1 tablet by mouth daily.    Historical Provider, MD  ondansetron (ZOFRAN ODT) 8 MG disintegrating tablet Take 1 tablet (8 mg total) by mouth every 8 (eight) hours as needed for nausea or vomiting. 11/27/14   Joni Reining, PA-C  penicillin v potassium (VEETID) 500 MG tablet Take 1 tablet (500 mg total) by mouth 4 (four) times daily. 04/27/16   Loleta Rose, MD     Allergies Patient has no known allergies.   No family history on file.  Social History Social History  Substance Use Topics  . Smoking status: Current Every Day Smoker    Types: Cigars  . Smokeless tobacco: Never Used  . Alcohol use No    Review of Systems  Constitutional:   No fever or chills.  ENT:   No sore  throat. No rhinorrhea. Cardiovascular:   No chest pain. Respiratory:   No dyspnea or cough. Gastrointestinal:   Negative for abdominal pain, vomiting and diarrhea.  Genitourinary:   Negative for dysuria or difficulty urinating.Positive vaginal bleeding Musculoskeletal:   Negative for focal pain or swelling Neurological:   Negative for headaches 10-point ROS otherwise negative.  ____________________________________________   PHYSICAL EXAM:  VITAL SIGNS: ED Triage Vitals  Enc Vitals Group     BP 06/19/16 1256 107/70     Pulse Rate 06/19/16 1256 82     Resp 06/19/16 1256 16     Temp 06/19/16 1256 99.8 F (37.7 C)     Temp Source 06/19/16 1256 Oral     SpO2 06/19/16 1256 98 %     Weight 06/19/16 1256 134 lb (60.8 kg)     Height 06/19/16 1256  (1.575 m)     Head Circumference --      Peak Flow --      Pain Score 06/19/16 1259 3     Pain Loc --      Pain Edu? --      Excl. in GC? --     Vital signs reviewed, nursing assessments reviewed.   Constitutional:   Alert and oriented. Well appearing and in no distress. Eyes:   No scleral icterus. No conjunctival pallor. PERRL. EOMI.  No nystagmus. ENT   Head:   Normocephalic and atraumatic.   Nose:  No congestion/rhinnorhea. No septal hematoma   Mouth/Throat:   MMM, no pharyngeal erythema. No peritonsillar mass.    Neck:   No stridor. No SubQ emphysema. No meningismus. Hematological/Lymphatic/Immunilogical:   No cervical lymphadenopathy. Cardiovascular:   RRR. Symmetric bilateral radial and DP pulses.  No murmurs.  Respiratory:   Normal respiratory effort without tachypnea nor retractions. Breath sounds are clear and equal bilaterally. No wheezes/rales/rhonchi. Gastrointestinal:   Soft and nontender. Non distended. There is no CVA tenderness.  No rebound, rigidity, or guarding. Genitourinary:   deferred Musculoskeletal:   Normal range of motion in all extremities. No joint effusions.  No lower extremity  tenderness.  No edema. Neurologic:   Normal speech and language.  CN 2-10 normal. Motor grossly intact. No gross focal neurologic deficits are appreciated.  Skin:    Skin is warm, dry and intact. No rash noted.  No petechiae, purpura, or bullae.  ____________________________________________    LABS (pertinent positives/negatives) (all labs ordered are listed, but only abnormal results are displayed) Labs Reviewed  POCT PREGNANCY, URINE   ____________________________________________   EKG    ____________________________________________    RADIOLOGY  No results found.  ____________________________________________   PROCEDURES Procedures  ____________________________________________   INITIAL IMPRESSION / ASSESSMENT AND PLAN / ED COURSE  Pertinent labs & imaging results that were available during my care of the patient were reviewed by me and considered in my medical decision making (see chart for details).  Well-appearing no distress. Exam benign, vitals normal. Symptoms are consistent with menses. Her period may be somewhat different than she sees to given that she is recently postpartum and had a Depo-Provera injection. Low suspicion for a STI PID TOA or torsion.Considering the patient's symptoms, medical history, and physical examination today, I have low suspicion for cholecystitis or biliary pathology, pancreatitis, perforation or bowel obstruction, hernia, intra-abdominal abscess, AAA or dissection, volvulus or intussusception, mesenteric ischemia, or appendicitis.           ____________________________________________   FINAL CLINICAL IMPRESSION(S) / ED DIAGNOSES  Final diagnoses:  Vaginal bleeding  Menses painful      New Prescriptions   No medications on file     Portions of this note were generated with dragon dictation software. Dictation errors may occur despite best attempts at proofreading.    Sharman Cheek, MD 06/19/16  1535

## 2016-06-19 NOTE — ED Triage Notes (Signed)
Pt c/o vaginal bleeding for the past 2 days and was concerned the she may have been pregnant, passing clots.. pt states she started the depo shot in february after having a child in December.Marland Kitchen

## 2016-06-19 NOTE — ED Notes (Signed)
Dr stafford at bedside 

## 2016-07-17 ENCOUNTER — Encounter: Payer: Self-pay | Admitting: Emergency Medicine

## 2016-07-17 ENCOUNTER — Emergency Department
Admission: EM | Admit: 2016-07-17 | Discharge: 2016-07-17 | Disposition: A | Discharge status: Home / Self Care | Payer: Medicaid Other | Attending: Emergency Medicine | Admitting: Emergency Medicine

## 2016-07-17 ENCOUNTER — Emergency Department
Admission: EM | Admit: 2016-07-17 | Discharge: 2016-07-17 | Disposition: A | Discharge status: Home / Self Care | Payer: Medicaid Other | Source: Home / Self Care | Attending: Emergency Medicine | Admitting: Emergency Medicine

## 2016-07-17 DIAGNOSIS — K2970 Gastritis, unspecified, without bleeding: Secondary | ICD-10-CM | POA: Diagnosis not present

## 2016-07-17 DIAGNOSIS — R1010 Upper abdominal pain, unspecified: Secondary | ICD-10-CM | POA: Diagnosis present

## 2016-07-17 DIAGNOSIS — F17290 Nicotine dependence, other tobacco product, uncomplicated: Secondary | ICD-10-CM | POA: Diagnosis not present

## 2016-07-17 LAB — URINALYSIS, ROUTINE W REFLEX MICROSCOPIC
BILIRUBIN URINE: NEGATIVE
Bacteria, UA: NONE SEEN
Glucose, UA: NEGATIVE mg/dL
HGB URINE DIPSTICK: NEGATIVE
Ketones, ur: 20 mg/dL — AB
NITRITE: NEGATIVE
Protein, ur: 30 mg/dL — AB
SPECIFIC GRAVITY, URINE: 1.031 — AB (ref 1.005–1.030)
pH: 6 (ref 5.0–8.0)

## 2016-07-17 LAB — COMPREHENSIVE METABOLIC PANEL
ALBUMIN: 4.7 g/dL (ref 3.5–5.0)
ALK PHOS: 70 U/L (ref 38–126)
ALT: 17 U/L (ref 14–54)
ANION GAP: 5 (ref 5–15)
AST: 27 U/L (ref 15–41)
BUN: 12 mg/dL (ref 6–20)
CALCIUM: 9.5 mg/dL (ref 8.9–10.3)
CO2: 25 mmol/L (ref 22–32)
Chloride: 110 mmol/L (ref 101–111)
Creatinine, Ser: 0.83 mg/dL (ref 0.44–1.00)
GFR calc Af Amer: >60 mL/min (ref >60)
GFR calc non Af Amer: >60 mL/min (ref >60)
GLUCOSE: 95 mg/dL (ref 65–99)
POTASSIUM: 3.5 mmol/L (ref 3.5–5.1)
Sodium: 140 mmol/L (ref 135–145)
TOTAL PROTEIN: 8.3 g/dL — AB (ref 6.5–8.1)
Total Bilirubin: 0.7 mg/dL (ref 0.3–1.2)

## 2016-07-17 LAB — POCT PREGNANCY, URINE: Preg Test, Ur: NEGATIVE

## 2016-07-17 LAB — CBC
HCT: 39.8 % (ref 35.0–47.0)
HEMOGLOBIN: 13.3 g/dL (ref 12.0–16.0)
MCH: 28.9 pg (ref 26.0–34.0)
MCHC: 33.4 g/dL (ref 32.0–36.0)
MCV: 86.5 fL (ref 80.0–100.0)
Platelets: 217 10*3/uL (ref 150–440)
RBC: 4.6 MIL/uL (ref 3.80–5.20)
RDW: 15.3 % — AB (ref 11.5–14.5)
WBC: 6.9 10*3/uL (ref 3.6–11.0)

## 2016-07-17 LAB — LIPASE, BLOOD: Lipase: 23 U/L (ref 11–51)

## 2016-07-17 MED ORDER — GI COCKTAIL ~~LOC~~
30.0000 mL | Freq: Once | ORAL | Status: AC
  Administered 2016-07-17: 30 mL via ORAL
  Filled 2016-07-17: qty 30

## 2016-07-17 MED ORDER — PANTOPRAZOLE SODIUM 40 MG PO TBEC: 40.0000 mg | DELAYED_RELEASE_TABLET | Freq: Every day | ORAL | 1 refills | Status: DC

## 2016-07-17 NOTE — Discharge Instructions (Signed)
Please take your medication as prescribed. As we discussed please also use over-the-counter Maalox 3 times daily as needed for abdominal pain. Please avoid using aspirin or ibuprofen, you may use Tylenol as needed as written on the box. If your pain is not gone in the next 7-10 days please follow-up with GI medicine by calling the number provided.

## 2016-07-17 NOTE — ED Triage Notes (Signed)
c/o mid abdominal pain since this morning with nausea.  Woke up at 2 AM and ate cheeseburger then pain started at 5. Denies vomiting/diarrhea. NAD. Skin warm and dry. Respirations unlabored. Denies urinary sx.

## 2016-07-17 NOTE — ED Notes (Addendum)
Pt brought in by [facility] from home c/o left sided burning abdominal pain since this AM. Pt was in ER today for same complaint, LWBS. Pt alert and oriented X4, active, cooperative, pt in NAD. RR even and unlabored, color WNL.    IV attempt by [facility] unsuccessful.

## 2016-07-17 NOTE — ED Provider Notes (Addendum)
[facility] Emergency Department Provider Note  Time seen: 6:22 PM  I have reviewed the triage vital signs and the nursing notes.   HISTORY  Chief Complaint Abdominal Pain    HPI Ann Padilla is a 26 y.o. female with no past medical history who presents to the emergency department for upper abdominal pain. According to the patient she awoke around 4:00 in the morning with upper abdominal pain. Patient states it feels like a burning sensation almost like gas. Patient states she took several gas pills today with some relief but continues to have pain. States nausea but denies any vomiting. Denies diarrhea. Denies fever. Denies any chest pain or trouble breathing. Denies any recent cough congestion or illness.  History reviewed. No pertinent past medical history.  There are no active problems to display for this patient.   Past Surgical History:  Procedure Laterality Date  . CESAREAN SECTION      Prior to Admission medications   Medication Sig Start Date End Date Taking? Authorizing Provider  cephALEXin (KEFLEX) 500 MG capsule Take 1 capsule (500 mg total) by mouth 2 (two) times daily. 09/24/15   Governor Rooks, MD  Multiple Vitamin (MULTIVITAMIN WITH MINERALS) TABS tablet Take 1 tablet by mouth daily.    [provider]  ondansetron (ZOFRAN ODT) 8 MG disintegrating tablet Take 1 tablet (8 mg total) by mouth every 8 (eight) hours as needed for nausea or vomiting. 11/27/14   Joni Reining, PA-C  penicillin v potassium (VEETID) 500 MG tablet Take 1 tablet (500 mg total) by mouth 4 (four) times daily. 04/27/16   Loleta Rose, MD    No Known Allergies  No family history on file.  Social History Social History  Substance Use Topics  . Smoking status: Current Every Day Smoker    Types: Cigars  . Smokeless tobacco: Never Used  . Alcohol use No    Review of Systems Constitutional: Negative for fever. Eyes: Negative for visual changes. ENT:  Negative for congestion Cardiovascular: Negative for chest pain. Respiratory: Negative for shortness of breath. Gastrointestinal: Upper abdominal pain. Positive for nausea. Negative for vomiting or diarrhea Genitourinary: Negative for dysuria. Musculoskeletal: Negative for back pain. Skin: Negative for rash. Neurological: Negative for headache All other ROS negative  ____________________________________________   PHYSICAL EXAM:  VITAL SIGNS: ED Triage Vitals  Enc Vitals Group     BP 07/17/16 1755 116/78     Pulse Rate 07/17/16 1755 90     Resp 07/17/16 1755 18     Temp 07/17/16 1755 99.4 F (37.4 C)     Temp Source 07/17/16 1755 Oral     SpO2 07/17/16 1755 99 %     Weight 07/17/16 1743 135 lb (61.2 kg)     Height 07/17/16 1743 5\' 2"  (1.575 m)     Head Circumference --      Peak Flow --      Pain Score 07/17/16 1743 10     Pain Loc --      Pain Edu? --      Excl. in GC? --     Constitutional: Alert and oriented. Mild distress due to upper abdominal pain. Eyes: Normal exam ENT   Head: Normocephalic and atraumatic.   Mouth/Throat: Mucous membranes are moist. Cardiovascular: Normal rate, regular rhythm. No murmur Respiratory: Normal respiratory effort without tachypnea nor retractions. Breath sounds are clear  Gastrointestinal: Soft, moderate epigastric tenderness palpation, no rebound or guarding. No distention. Abdomen otherwise benign. No right upper  quadrant tenderness. No CVA tenderness. Musculoskeletal: Nontender with normal range of motion in all extremities.  Neurologic:  Normal speech and language. No gross focal neurologic deficits  Skin:  Skin is warm, dry and intact.  Psychiatric: Mood and affect are normal.   ____________________________________________   INITIAL IMPRESSION / ASSESSMENT AND PLAN / ED COURSE  Pertinent labs & imaging results that were available during my care of the patient were reviewed by me and considered in my medical decision  making (see chart for details).  The patient presents to the emergency department with burning epigastric discomfort. Patient states the pain started around 4:00 the morning. She does state she ate a [employer] cheeseburger around 2:30 in the morning. Patient has no right upper quadrant tenderness on exam. Liver function tests are normal. White blood cell count is normal. Lipase is normal. We will treat with a GI cocktail, and reassess. Symptoms are very suggestive of gastritis. Patient has no lower abdominal tenderness. However we will check a urinalysis as well as a POC pregnancy test.  Urinalysis largely negative. Patient states she feels much better after GI cocktail. Highly suspect gastritis. We will discharge the patient home with Protonix, Maalox and GI follow-up. Patient agreeable to plan. Patient states very frequent use of [employer]/BC powders, which could have precipitated an episode of gastritis. I discussed with the patient avoiding aspirin, NSAIDs and alcohol.  ____________________________________________   FINAL CLINICAL IMPRESSION(S) / ED DIAGNOSES  Upper abdominal pain Gastritis   Minna AntisPaduchowski, Samarra Ridgely, MD 07/17/16 2112    Minna AntisPaduchowski, Donat Humble, MD 07/17/16 2114

## 2016-07-17 NOTE — ED Notes (Signed)
Esign not working. Pt verbalized understanding of discharge instructions. Pt has no questions at this time.

## 2016-07-17 NOTE — ED Notes (Signed)
Pt called for be roomed X 1. No answer. [employer] officer states that he saw patient getting in her car.

## 2016-07-17 NOTE — ED Notes (Signed)
Pt called again, no answer. Pt not visible in lobby.

## 2016-07-17 NOTE — ED Notes (Signed)
Pt unable to urinate at this time, given specimen cup and sent back out to lobby.  

## 2017-08-29 ENCOUNTER — Other Ambulatory Visit: Payer: Self-pay

## 2017-08-29 ENCOUNTER — Emergency Department
Admission: EM | Admit: 2017-08-29 | Discharge: 2017-08-29 | Disposition: A | Discharge status: Home / Self Care | Payer: Medicaid Other | Attending: Emergency Medicine | Admitting: Emergency Medicine

## 2017-08-29 DIAGNOSIS — Z79899 Other long term (current) drug therapy: Secondary | ICD-10-CM | POA: Diagnosis not present

## 2017-08-29 DIAGNOSIS — E876 Hypokalemia: Secondary | ICD-10-CM | POA: Diagnosis not present

## 2017-08-29 DIAGNOSIS — F17290 Nicotine dependence, other tobacco product, uncomplicated: Secondary | ICD-10-CM | POA: Insufficient documentation

## 2017-08-29 DIAGNOSIS — R55 Syncope and collapse: Secondary | ICD-10-CM | POA: Insufficient documentation

## 2017-08-29 LAB — CBC
HEMATOCRIT: 35.9 % (ref 35.0–47.0)
HEMOGLOBIN: 12.4 g/dL (ref 12.0–16.0)
MCH: 30.7 pg (ref 26.0–34.0)
MCHC: 34.4 g/dL (ref 32.0–36.0)
MCV: 89.1 fL (ref 80.0–100.0)
Platelets: 288 10*3/uL (ref 150–440)
RBC: 4.03 MIL/uL (ref 3.80–5.20)
RDW: 15.3 % — ABNORMAL HIGH (ref 11.5–14.5)
WBC: 8.2 10*3/uL (ref 3.6–11.0)

## 2017-08-29 LAB — BASIC METABOLIC PANEL
Anion gap: 8 (ref 5–15)
BUN: 13 mg/dL (ref 6–20)
CHLORIDE: 107 mmol/L (ref 101–111)
CO2: 24 mmol/L (ref 22–32)
Calcium: 8.7 mg/dL — ABNORMAL LOW (ref 8.9–10.3)
Creatinine, Ser: 0.98 mg/dL (ref 0.44–1.00)
GFR calc Af Amer: >60 mL/min (ref >60)
GFR calc non Af Amer: >60 mL/min (ref >60)
Glucose, Bld: 158 mg/dL — ABNORMAL HIGH (ref 65–99)
POTASSIUM: 2.7 mmol/L — AB (ref 3.5–5.1)
SODIUM: 139 mmol/L (ref 135–145)

## 2017-08-29 LAB — HCG, QUANTITATIVE, PREGNANCY: hCG, Beta Chain, Quant, S: <1 m[IU]/mL (ref <5)

## 2017-08-29 LAB — TROPONIN I

## 2017-08-29 LAB — MAGNESIUM: Magnesium: 1.8 mg/dL (ref 1.7–2.4)

## 2017-08-29 MED ORDER — POTASSIUM CHLORIDE 10 MEQ/100ML IV SOLN
10.0000 meq | Freq: Once | INTRAVENOUS | Status: AC
  Administered 2017-08-29: 10 meq via INTRAVENOUS
  Filled 2017-08-29: qty 100

## 2017-08-29 MED ORDER — SODIUM CHLORIDE 0.9 % IV BOLUS
1000.0000 mL | Freq: Once | INTRAVENOUS | Status: AC
  Administered 2017-08-29: 1000 mL via INTRAVENOUS

## 2017-08-29 MED ORDER — ONDANSETRON HCL 4 MG/2ML IJ SOLN
4.0000 mg | Freq: Once | INTRAMUSCULAR | Status: AC
  Administered 2017-08-29: 4 mg via INTRAVENOUS
  Filled 2017-08-29: qty 2

## 2017-08-29 MED ORDER — EPINEPHRINE 0.3 MG/0.3ML IJ SOAJ: 0.3000 mg | Freq: Once | INTRAMUSCULAR | 0 refills | Status: AC

## 2017-08-29 MED ORDER — POTASSIUM CHLORIDE CRYS ER 20 MEQ PO TBCR
40.0000 meq | EXTENDED_RELEASE_TABLET | Freq: Once | ORAL | Status: AC
  Administered 2017-08-29: 40 meq via ORAL
  Filled 2017-08-29: qty 2

## 2017-08-29 MED ORDER — POTASSIUM CHLORIDE ER 10 MEQ PO TBCR: 10.0000 meq | EXTENDED_RELEASE_TABLET | Freq: Every day | ORAL | 1 refills | Status: DC

## 2017-08-29 NOTE — ED Notes (Signed)

## 2017-08-29 NOTE — ED Provider Notes (Signed)
**Note Ann-Identified via Obfuscation** [facility] Emergency Department Provider Note  ____________________________________________  Time seen: Approximately 2:23 PM  I have reviewed the triage vital signs and the nursing notes.   HISTORY  Chief Complaint Near Syncope   HPI Ann Padilla is a 27 y.o. female with no significant past medical history who presents for evaluation after syncopal episode.  Patient reports that she was outside when she was bitten in her foot by fire ants.  She went inside and sat on the couch.  She reports that she started feeling burning inside.  She reports that that made her feel very panicky and she started to hyperventilate.  She went to the bathroom and sat on the toilet. She started to feel more anxious and developed nausea.  Her husband that heard a loud noise and found her on the ground passed out next to the toilet with vomit on her.  He reports the patient was unconscious for 4 to 5 minutes.  No seizure-like activity, no urinary or bowel loss, no tongue trauma, no postictal phase.  Patient denies hives, angioedema, stridor, difficulty breathing or swallowing, diarrhea.  She denies any prior history of anaphylaxis.  She denies any prior history of syncope.  Patient denies any injuries from the syncopal event.  PMH Anemia during pregnancy   Past Surgical History:  Procedure Laterality Date  . CESAREAN SECTION      Prior to Admission medications   Medication Sig Start Date End Date Taking? Authorizing Provider  cephALEXin (KEFLEX) 500 MG capsule Take 1 capsule (500 mg total) by mouth 2 (two) times daily. 09/24/15   Governor RooksLord, Rebecca, MD  EPINEPHrine 0.3 mg/0.3 mL IJ SOAJ injection Inject 0.3 mLs (0.3 mg total) into the muscle once for 1 dose. 08/29/17 08/29/17  Nita SickleVeronese, [place], MD  Multiple Vitamin (MULTIVITAMIN WITH MINERALS) TABS tablet Take 1 tablet by mouth daily.    [provider]  ondansetron (ZOFRAN ODT) 8 MG disintegrating tablet Take 1 tablet (8 mg  total) by mouth every 8 (eight) hours as needed for nausea or vomiting. 11/27/14   Joni ReiningSmith, Ronald K, PA-C  pantoprazole (PROTONIX) 40 MG tablet Take 1 tablet (40 mg total) by mouth daily. 07/17/16 07/17/17  Minna AntisPaduchowski, Kevin, MD  penicillin v potassium (VEETID) 500 MG tablet Take 1 tablet (500 mg total) by mouth 4 (four) times daily. 04/27/16   Loleta RoseForbach, Cory, MD  potassium chloride (K-DUR) 10 MEQ tablet Take 1 tablet (10 mEq total) by mouth daily. 08/29/17   Nita SickleVeronese, [place], MD    Allergies Patient has no known allergies.  FH Heart disease Maternal Grandfather    Social History Social History   Tobacco Use  . Smoking status: Current Every Day Smoker    Types: Cigars  . Smokeless tobacco: Never Used  Substance Use Topics  . Alcohol use: No  . Drug use: No    Review of Systems  Constitutional: Negative for fever. + syncope Eyes: Negative for visual changes. ENT: Negative for sore throat. Neck: No neck pain  Cardiovascular: Negative for chest pain. Respiratory: Negative for shortness of breath. Gastrointestinal: Negative for abdominal pain or diarrhea. + vomiting Genitourinary: Negative for dysuria. Musculoskeletal: Negative for back pain. Skin: Negative for rash. Neurological: Negative for headaches, weakness or numbness. Psych: No SI or HI  ____________________________________________   PHYSICAL EXAM:  VITAL SIGNS: Vitals:   08/29/17 1606 08/29/17 1631  BP:  107/64  Pulse: 84 95  Resp: 15 13  Temp:    SpO2: 98% 97%  Constitutional: Alert and oriented. Well appearing and in no apparent distress. HEENT:      Head: Normocephalic and atraumatic.         Eyes: Conjunctivae are normal. Sclera is non-icteric.       Mouth/Throat: Mucous membranes are moist.  No angioedema, no stridor, uvula and tongue are normal size, handling her saliva with no difficulty      Neck: Supple with no signs of meningismus. Cardiovascular: Regular rate and rhythm. No murmurs, gallops,  or rubs. 2+ symmetrical distal pulses are present in all extremities. No JVD. Respiratory: Normal respiratory effort. Lungs are clear to auscultation bilaterally. No wheezes, crackles, or rhonchi.  Gastrointestinal: Soft, non tender, and non distended with positive bowel sounds. No rebound or guarding. Musculoskeletal: Nontender with normal range of motion in all extremities. No edema, cyanosis, or erythema of extremities. Neurologic: Normal speech and language. Face is symmetric. Moving all extremities. No gross focal neurologic deficits are appreciated. Skin: Skin is warm, dry and intact. No rash noted.  There are a few bites to her R foot Psychiatric: Mood and affect are normal. Speech and behavior are normal.  ____________________________________________   LABS (all labs ordered are listed, but only abnormal results are displayed)  Labs Reviewed  CBC - Abnormal; Notable for the following components:      Result Value   RDW 15.3 (*)    All other components within normal limits  BASIC METABOLIC PANEL - Abnormal; Notable for the following components:   Potassium 2.7 (*)    Glucose, Bld 158 (*)    Calcium 8.7 (*)    All other components within normal limits  HCG, QUANTITATIVE, PREGNANCY  TROPONIN I  MAGNESIUM   ____________________________________________  EKG  ED ECG REPORT I, Nita Sickle, the attending physician, personally viewed and interpreted this ECG.  Normal sinus rhythm, normal intervals, normal axis, no STE or depressions, no evidence of HOCM, AV block, delta wave, ARVD, prolonged QTc, WPW, or Brugada. TWI in anterior leads. No prior from comparison   ____________________________________________  RADIOLOGY  none  ____________________________________________   PROCEDURES  Procedure(s) performed: None Procedures Critical Care performed:  None ____________________________________________   INITIAL IMPRESSION / ASSESSMENT AND PLAN / ED COURSE   27  y.o. female with no significant past medical history who presents for evaluation after syncopal episode preceded by being bitten by ants, feeling hot and hyperventilating. Ddx anaphylaxis vs panic attack/ hyperventilation vs vasovagal. No signs of anaphylaxis at this time. Patient's only complaint now is mild nausea. EKG showing no evidence of dysrhythmias.  Patient does have T wave inversions in anterior leads with no old EKG for comparison.  Monitor on telemetry for any signs of cardiac dysrhythmia. Will check hCG to rule out pregnancy. Will give IVF and zofran    _________________________ 5:14 PM on 08/29/2017 -----------------------------------------  Patient's labs showing hypokalemia.  Patient was supplemented IV n.p.o.  Her telemetry strip was reviewed with no cardiac dysrhythmias.  Patient was monitored on telemetry for 3 hours.  Discussed foods that are rich in potassium and will give a prescription for potassium p.o.  Patient will also receive a prescription for an EpiPen in case this was an anaphylactic reaction.  Recommended close follow-up with primary care doctor.  Discussed return precautions with patient.   As part of my medical decision making, I reviewed the following data within the electronic MEDICAL RECORD NUMBER History obtained from family, Nursing notes reviewed and incorporated, Labs reviewed , EKG interpreted , Old chart reviewed, Notes  from prior ED visits and [place] Controlled Substance Database    Pertinent labs & imaging results that were available during my care of the patient were reviewed by me and considered in my medical decision making (see chart for details).    ____________________________________________   FINAL CLINICAL IMPRESSION(S) / ED DIAGNOSES  Final diagnoses:  Syncope, unspecified syncope type  Hypokalemia      NEW MEDICATIONS STARTED DURING THIS VISIT:  ED Discharge Orders        Ordered    potassium chloride (K-DUR) 10 MEQ tablet  Daily      08/29/17 1713    EPINEPHrine 0.3 mg/0.3 mL IJ SOAJ injection   Once     08/29/17 1713       Note:  This document was prepared using Dragon voice recognition software and may include unintentional dictation errors.    Don Perking, [place], MD 08/29/17 [phone]

## 2017-08-29 NOTE — ED Triage Notes (Signed)
Pt arrives via [facility] from home for syncopal episode. Pt state she has never passed out before. Pt states she was bitten by ants and then felt warm and went to bathroom and passed out. Pt arrives with 20 G in left wrist.

## 2017-11-11 ENCOUNTER — Emergency Department: Payer: Medicaid Other

## 2017-11-11 ENCOUNTER — Other Ambulatory Visit: Payer: Self-pay

## 2017-11-11 ENCOUNTER — Emergency Department
Admission: EM | Admit: 2017-11-11 | Discharge: 2017-11-12 | Disposition: A | Discharge status: Home / Self Care | Payer: Medicaid Other | Attending: Emergency Medicine | Admitting: Emergency Medicine

## 2017-11-11 ENCOUNTER — Encounter: Payer: Self-pay | Admitting: Emergency Medicine

## 2017-11-11 DIAGNOSIS — R002 Palpitations: Secondary | ICD-10-CM

## 2017-11-11 DIAGNOSIS — N390 Urinary tract infection, site not specified: Secondary | ICD-10-CM | POA: Diagnosis not present

## 2017-11-11 DIAGNOSIS — Z79899 Other long term (current) drug therapy: Secondary | ICD-10-CM | POA: Insufficient documentation

## 2017-11-11 DIAGNOSIS — F17290 Nicotine dependence, other tobacco product, uncomplicated: Secondary | ICD-10-CM | POA: Diagnosis not present

## 2017-11-11 LAB — CBC
HCT: 36.4 % (ref 35.0–47.0)
HEMOGLOBIN: 12.5 g/dL (ref 12.0–16.0)
MCH: 30 pg (ref 26.0–34.0)
MCHC: 34.3 g/dL (ref 32.0–36.0)
MCV: 87.4 fL (ref 80.0–100.0)
Platelets: 238 10*3/uL (ref 150–440)
RBC: 4.17 MIL/uL (ref 3.80–5.20)
RDW: 15.6 % — ABNORMAL HIGH (ref 11.5–14.5)
WBC: 9.3 10*3/uL (ref 3.6–11.0)

## 2017-11-11 LAB — BASIC METABOLIC PANEL
ANION GAP: 7 (ref 5–15)
BUN: 12 mg/dL (ref 6–20)
CO2: 25 mmol/L (ref 22–32)
CREATININE: 0.67 mg/dL (ref 0.44–1.00)
Calcium: 9.4 mg/dL (ref 8.9–10.3)
Chloride: 107 mmol/L (ref 98–111)
GFR calc non Af Amer: >60 mL/min (ref >60)
GLUCOSE: 96 mg/dL (ref 70–99)
Potassium: 3.8 mmol/L (ref 3.5–5.1)
Sodium: 139 mmol/L (ref 135–145)

## 2017-11-11 LAB — TROPONIN I: Troponin I: <0.03 ng/mL (ref <0.03)

## 2017-11-11 NOTE — ED Triage Notes (Signed)
Patient ambulatory to triage with steady gait, without difficulty or distress noted; pt reports while at work began having sensation heart beating fast accomp by light-headedness; denies pain; st hx of same with syncope 48mos ago; seen here with normal findings

## 2017-11-12 LAB — URINALYSIS, COMPLETE (UACMP) WITH MICROSCOPIC
BILIRUBIN URINE: NEGATIVE
Bacteria, UA: NONE SEEN
Glucose, UA: NEGATIVE mg/dL
Hgb urine dipstick: NEGATIVE
KETONES UR: NEGATIVE mg/dL
Nitrite: NEGATIVE
PROTEIN: NEGATIVE mg/dL
Specific Gravity, Urine: 1.024 (ref 1.005–1.030)
pH: 5 (ref 5.0–8.0)

## 2017-11-12 LAB — URINE DRUG SCREEN, QUALITATIVE (ARMC ONLY)
AMPHETAMINES, UR SCREEN: NOT DETECTED
Barbiturates, Ur Screen: NOT DETECTED
COCAINE METABOLITE, UR ~~LOC~~: NOT DETECTED
Cannabinoid 50 Ng, Ur ~~LOC~~: NOT DETECTED
MDMA (ECSTASY) UR SCREEN: NOT DETECTED
Methadone Scn, Ur: NOT DETECTED
OPIATE, UR SCREEN: NOT DETECTED
Phencyclidine (PCP) Ur S: NOT DETECTED
Tricyclic, Ur Screen: NOT DETECTED

## 2017-11-12 LAB — FIBRIN DERIVATIVES D-DIMER (ARMC ONLY): FIBRIN DERIVATIVES D-DIMER (ARMC): 187.68 ng{FEU}/mL (ref 0.00–499.00)

## 2017-11-12 LAB — POCT PREGNANCY, URINE: PREG TEST UR: NEGATIVE

## 2017-11-12 LAB — T4, FREE: Free T4: 1.04 ng/dL (ref 0.82–1.77)

## 2017-11-12 LAB — TSH: TSH: 2.321 u[IU]/mL (ref 0.350–4.500)

## 2017-11-12 MED ORDER — FOSFOMYCIN TROMETHAMINE 3 G PO PACK
3.0000 g | PACK | Freq: Once | ORAL | Status: AC
  Administered 2017-11-12: 3 g via ORAL
  Filled 2017-11-12: qty 3

## 2017-11-12 MED ORDER — KETOROLAC TROMETHAMINE 30 MG/ML IJ SOLN
INTRAMUSCULAR | Status: AC
  Filled 2017-11-12: qty 1

## 2017-11-12 MED ORDER — KETOROLAC TROMETHAMINE 30 MG/ML IJ SOLN
15.0000 mg | Freq: Once | INTRAMUSCULAR | Status: AC
  Administered 2017-11-12: 15 mg via INTRAVENOUS

## 2017-11-12 MED ORDER — KETOROLAC TROMETHAMINE 30 MG/ML IJ SOLN: 15.0000 mg | Freq: Once | INTRAMUSCULAR | Status: DC

## 2017-11-12 NOTE — ED Provider Notes (Signed)
[facility] Emergency Department Provider Note   ____________________________________________   First MD Initiated Contact with Patient 11/12/17 0038     (approximate)  I have reviewed the triage vital signs and the nursing notes.   HISTORY  Chief Complaint Palpitations    HPI Ann Padilla is a 27 y.o. female who presents to the ED from home with a chief complaint of palpitations.  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  Seen in the ED in June with syncopal episode with similar symptoms.  Did not follow-up with cardiology.  Reports drinking multiple sodas daily.  Denies recent fever, chills, chest pain, shortness of breath, abdominal pain, nausea or vomiting.  Denies recent travel or trauma.  Recently got off of Depo shot.   Past medical history None  There are no active problems to display for this patient.   Past Surgical History:  Procedure Laterality Date  . CESAREAN SECTION      Prior to Admission medications   Medication Sig Start Date End Date Taking? Authorizing Provider  cephALEXin (KEFLEX) 500 MG capsule Take 1 capsule (500 mg total) by mouth 2 (two) times daily. 09/24/15   Governor Rooks, MD  Multiple Vitamin (MULTIVITAMIN WITH MINERALS) TABS tablet Take 1 tablet by mouth daily.    [provider]  ondansetron (ZOFRAN ODT) 8 MG disintegrating tablet Take 1 tablet (8 mg total) by mouth every 8 (eight) hours as needed for nausea or vomiting. 11/27/14   Joni Reining, PA-C  pantoprazole (PROTONIX) 40 MG tablet Take 1 tablet (40 mg total) by mouth daily. 07/17/16 07/17/17  Minna Antis, MD  penicillin v potassium (VEETID) 500 MG tablet Take 1 tablet (500 mg total) by mouth 4 (four) times daily. 04/27/16   Loleta Rose, MD  potassium chloride (K-DUR) 10 MEQ tablet Take 1 tablet (10 mEq total) by mouth daily. 08/29/17   Nita Sickle, MD    Allergies Patient has  no known allergies.  No family history on file.  Social History Social History   Tobacco Use  . Smoking status: Current Every Day Smoker    Types: Cigars  . Smokeless tobacco: Never Used  Substance Use Topics  . Alcohol use: No  . Drug use: No    Review of Systems  Constitutional: No fever/chills Eyes: No visual changes. ENT: No sore throat. Cardiovascular: Positive for palpitations.  Denies chest pain. Respiratory: Denies shortness of breath. Gastrointestinal: No abdominal pain.  No nausea, no vomiting.  No diarrhea.  No constipation. Genitourinary: Negative for dysuria. Musculoskeletal: Negative for back pain. Skin: Negative for rash. Neurological: Negative for headaches, focal weakness or numbness.   ____________________________________________   PHYSICAL EXAM:  VITAL SIGNS: ED Triage Vitals [11/11/17 2140]  Enc Vitals Group     BP 117/76     Pulse Rate 82     Resp 18     Temp 98.6 F (37 C)     Temp Source Oral     SpO2 100 %     Weight 140 lb (63.5 kg)     Height 5\' 2"  (1.575 m)     Head Circumference      Peak Flow      Pain Score 0     Pain Loc      Pain Edu?      Excl. in GC?     Constitutional: Alert and oriented. Well appearing and in no acute distress.  Texting  on cell phone. Eyes: Conjunctivae are normal. PERRL. EOMI. Head: Atraumatic. Nose: No congestion/rhinnorhea. Mouth/Throat: Mucous membranes are moist.  Oropharynx non-erythematous. Neck: No stridor.  No thyromegaly. Cardiovascular: Normal rate, regular rhythm. Grossly normal heart sounds.  Good peripheral circulation. Respiratory: Normal respiratory effort.  No retractions. Lungs CTAB. Gastrointestinal: Soft and nontender. No distention. No abdominal bruits. No CVA tenderness. Musculoskeletal: No lower extremity tenderness nor edema.  No joint effusions. Neurologic:  Normal speech and language. No gross focal neurologic deficits are appreciated. No gait instability. Skin:  Skin is  warm, dry and intact. No rash noted. Psychiatric: Mood and affect are normal. Speech and behavior are normal.  ____________________________________________   LABS (all labs ordered are listed, but only abnormal results are displayed)  Labs Reviewed  CBC - Abnormal; Notable for the following components:      Result Value   RDW 15.6 (*)    All other components within normal limits  URINE DRUG SCREEN, QUALITATIVE ([facility] ONLY) - Abnormal; Notable for the following components:   Benzodiazepine, Ur Scrn TEST NOT PERFORMED, REAGENT NOT AVAILABLE (*)    All other components within normal limits  URINALYSIS, COMPLETE (UACMP) WITH MICROSCOPIC - Abnormal; Notable for the following components:   Color, Urine YELLOW (*)    APPearance HAZY (*)    Leukocytes, UA SMALL (*)    All other components within normal limits  BASIC METABOLIC PANEL  TROPONIN I  TSH  T4, FREE  FIBRIN DERIVATIVES D-DIMER ([facility] ONLY)  POC URINE PREG, ED  POCT PREGNANCY, URINE   ____________________________________________  EKG  ED ECG REPORT I, SUNG,JADE J, the attending physician, personally viewed and interpreted this ECG.   Date: 11/12/2017  EKG Time: 2139  Rate: 87  Rhythm: normal EKG, normal sinus rhythm  Axis: Normal  Intervals:none  ST&T Change: Nonspecific  ____________________________________________  RADIOLOGY  ED MD interpretation: No acute cardiopulmonary process  Official radiology report(s): Dg Chest 2 View  Result Date: 11/11/2017 CLINICAL DATA:  Sensation of fast heartbeat with lightheadedness. History of syncope 2 months ago. EXAM: CHEST - 2 VIEW COMPARISON:  None. FINDINGS: The heart size and mediastinal contours are within normal limits. Both lungs are clear. The visualized skeletal structures are unremarkable. IMPRESSION: No active cardiopulmonary disease. Electronically Signed   By: Burman Nieves M.D.   On: 11/11/2017 22:05     ____________________________________________   PROCEDURES  Procedure(s) performed: None  Procedures  Critical Care performed: No  ____________________________________________   INITIAL IMPRESSION / ASSESSMENT AND PLAN / ED COURSE  As part of my medical decision making, I reviewed the following data within the electronic MEDICAL RECORD NUMBER Nursing notes reviewed and incorporated, Labs reviewed, EKG interpreted, Old chart reviewed, Radiograph reviewed and Notes from prior ED visits   27 year old otherwise healthy female who presents with palpitations and lightheadedness.  Differential diagnosis includes but is not limited to CAD, PE, dissection, dehydration, illicit drug use, thyroid disorder, metabolic, infectious etiologies, etc.  Initial lab work unremarkable.  Will check thyroid panel, d-dimer, UDS and reassess.  Clinical Course as of Nov 12 533  Thu Nov 12, 2017  0242 Patient resting in no acute distress.  Updated her on all test results.  Will administer single dose fosfomycin in the ED.  Refer to cardiology for outpatient follow-up.  Advised patient to reduce caffeine intake.  Strict return precautions given.  Patient verbalizes understanding and agrees with plan of care.   [JS]    Clinical Course User Index [JS] Irean Hong, MD  ____________________________________________   FINAL CLINICAL IMPRESSION(S) / ED DIAGNOSES  Final diagnoses:  Palpitations  Lower urinary tract infectious disease     ED Discharge Orders    None       Note:  This document was prepared using Dragon voice recognition software and may include unintentional dictation errors.    Irean Hong, MD 11/12/17 [phone]

## 2017-11-12 NOTE — ED Notes (Signed)
Pt given Ginger Ale to drink at this time per verbal okay by Dr Dolores Frame.

## 2017-11-12 NOTE — ED Notes (Signed)
Pt aware that we need Urine Specimen, states she is unable to go at this time.

## 2017-11-12 NOTE — Discharge Instructions (Signed)
You have been treated with a single dose of antibiotics while in the emergency department.  Reduce your caffeine intake and drink more water daily.  Return to the ER for worsening symptoms, persistent vomiting, difficulty breathing or other concerns.

## 2018-03-12 IMAGING — US US OB LIMITED
1 series · 14 of 28 positions shown · non-contrast
Comparison: none

CLINICAL DATA: Pregnant patient in second trimester pregnancy with
pelvic pressure and back pain.

EXAM:
LIMITED OBSTETRIC ULTRASOUND

[Series 1: us ob limited · 0.23mm/px · 52 acquisitions, 14 frames shown]
[im 2/52]
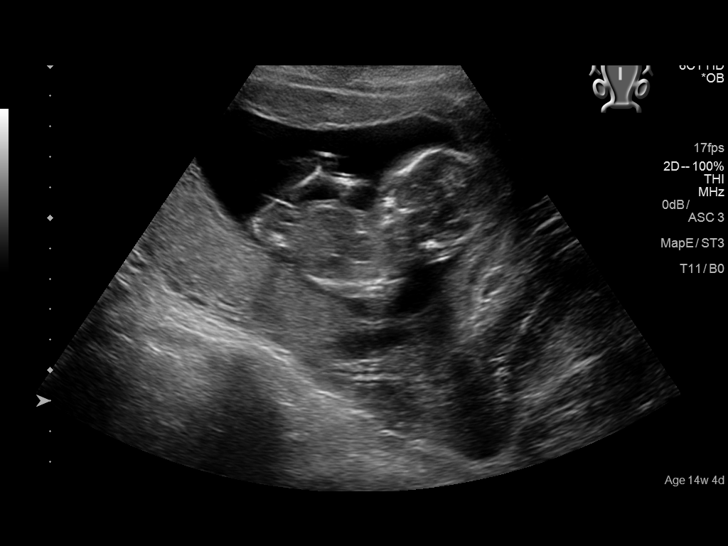
[im 6/52]
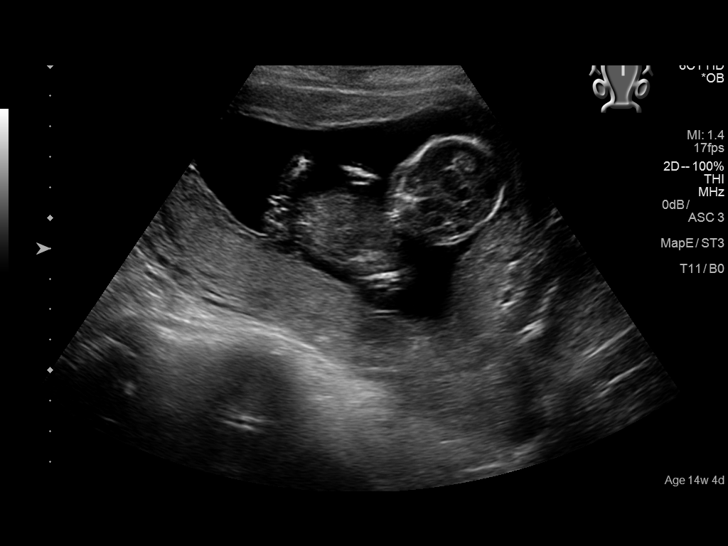
[im 10/52]
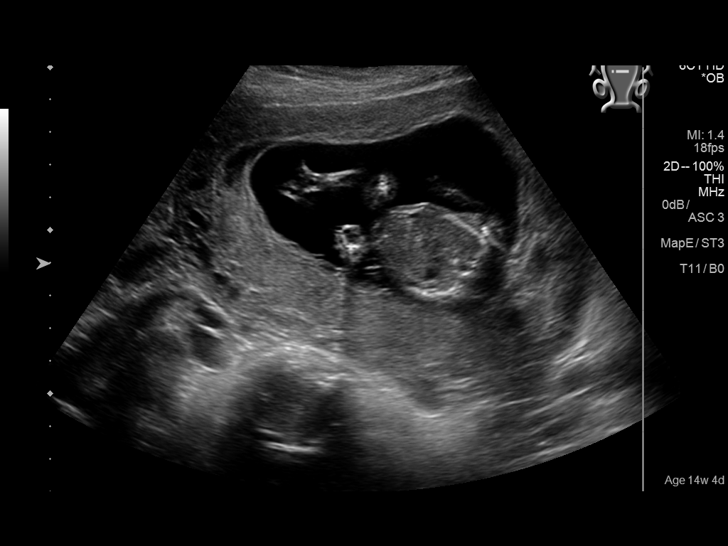
[im 14/52]
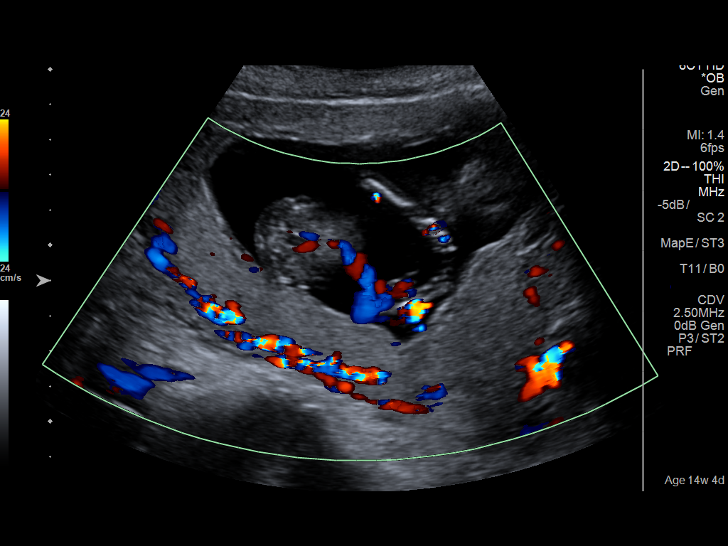
[im 18/52]
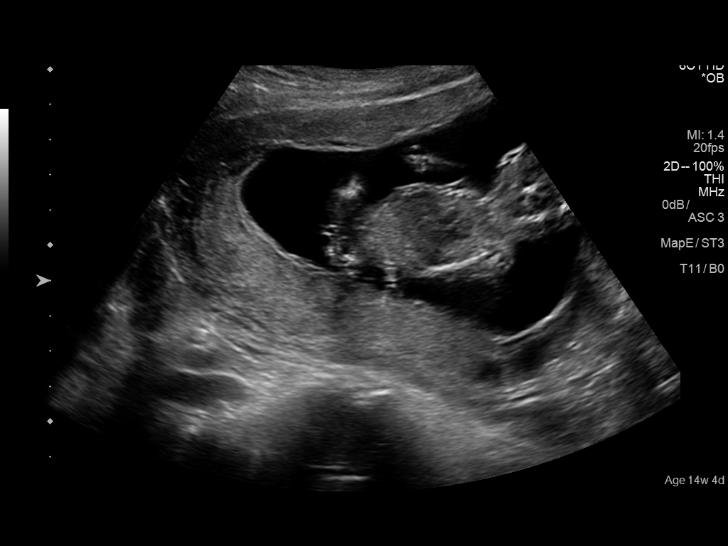
[im 21/52]
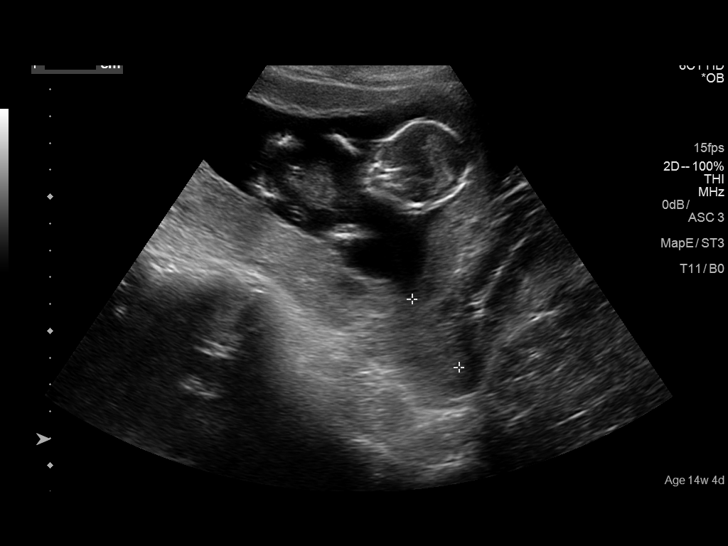
[im 25/52]
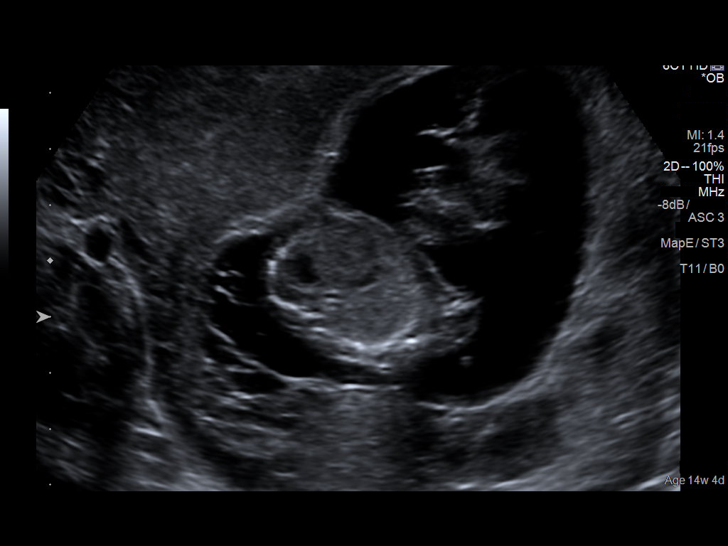
[im 29/52]
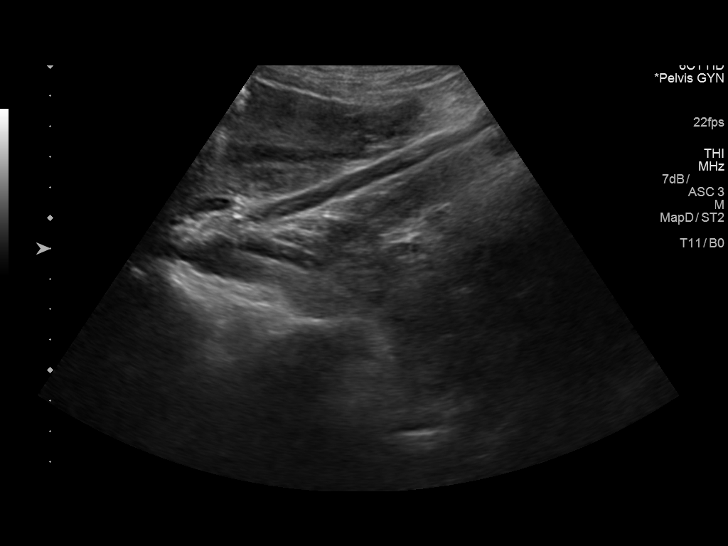
[im 33/52]
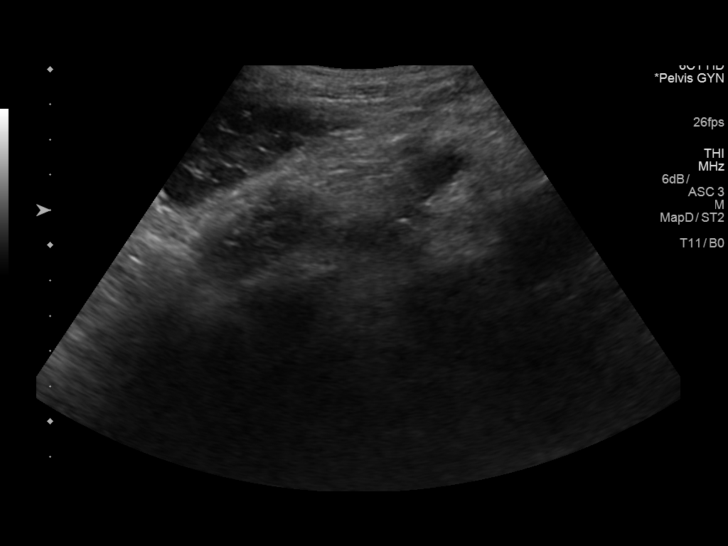
[im 36/52]
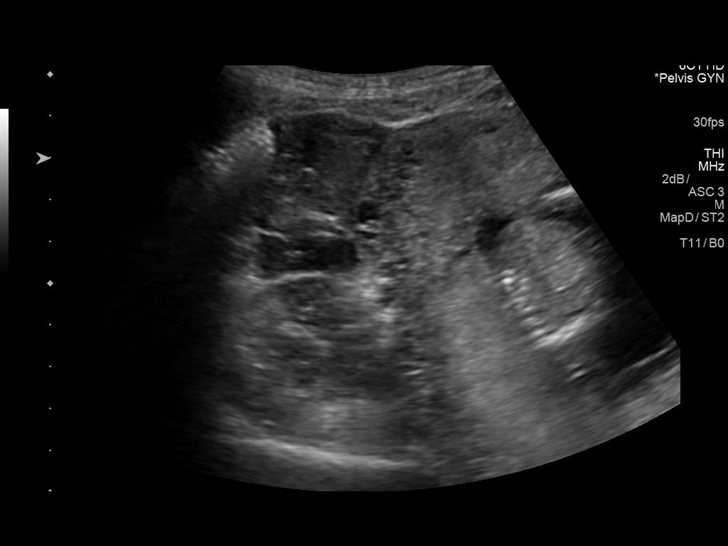
[im 40/52]
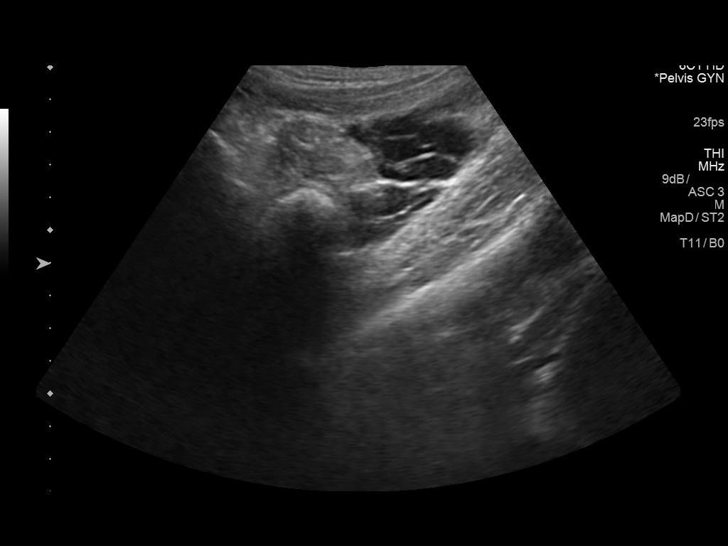
[im 44/52]
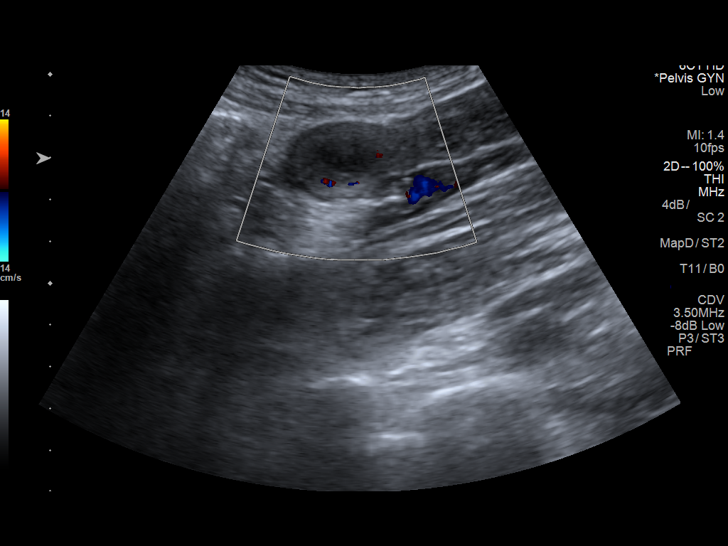
[im 48/52]
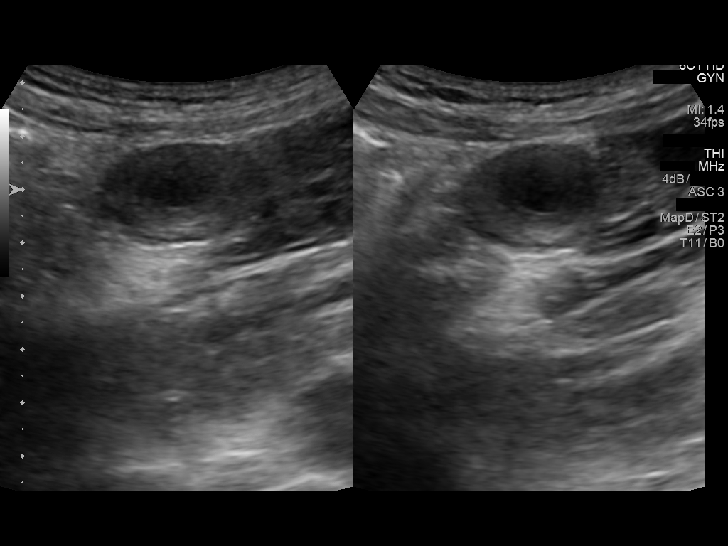
[im 52/52]
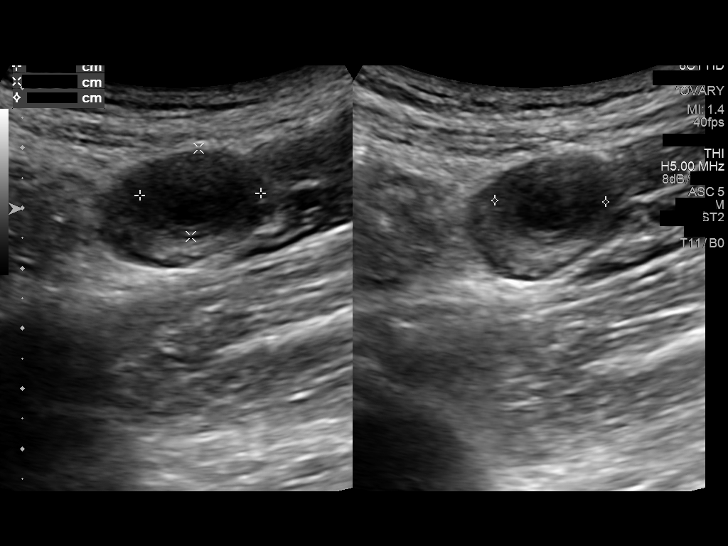

[14 of 28 positions shown; findings below may reference images not displayed]

FINDINGS: Number of Fetuses:  1

Heart Rate:  160 bpm

Movement:  Yes

Presentation: Variable

Placental Location: Posterior

Previa: No

Amniotic Fluid (Subjective):  Within normal limits.

FL:  1.57cm 14w  4d

MATERNAL FINDINGS:

Cervix:  Appears closed, 3.1 cm in length.

Uterus/Adnexae: No abnormality visualized. There is a questionable
corpus luteal cyst on the left ovary. Blood flow seen.
IMPRESSION: Single live intrauterine pregnancy estimated gestational age 14
weeks 4 days for estimated date of delivery 03/20/2016. No apparent
complications.

This exam is performed on an emergent basis and does not
comprehensively evaluate fetal size, dating, or anatomy; follow-up
complete OB US should be considered if further fetal assessment is
warranted.

## 2018-06-28 ENCOUNTER — Emergency Department
Admission: EM | Admit: 2018-06-28 | Discharge: 2018-06-28 | Disposition: A | Discharge status: Home / Self Care | Payer: Medicaid Other | Attending: Emergency Medicine | Admitting: Emergency Medicine

## 2018-06-28 ENCOUNTER — Encounter: Payer: Self-pay | Admitting: Emergency Medicine

## 2018-06-28 ENCOUNTER — Other Ambulatory Visit: Payer: Self-pay

## 2018-06-28 DIAGNOSIS — F17210 Nicotine dependence, cigarettes, uncomplicated: Secondary | ICD-10-CM | POA: Diagnosis not present

## 2018-06-28 DIAGNOSIS — B9689 Other specified bacterial agents as the cause of diseases classified elsewhere: Secondary | ICD-10-CM | POA: Diagnosis not present

## 2018-06-28 DIAGNOSIS — R110 Nausea: Secondary | ICD-10-CM | POA: Diagnosis present

## 2018-06-28 DIAGNOSIS — N760 Acute vaginitis: Secondary | ICD-10-CM | POA: Insufficient documentation

## 2018-06-28 DIAGNOSIS — K219 Gastro-esophageal reflux disease without esophagitis: Secondary | ICD-10-CM | POA: Diagnosis not present

## 2018-06-28 LAB — COMPREHENSIVE METABOLIC PANEL
ALT: 11 U/L (ref 0–44)
AST: 18 U/L (ref 15–41)
Albumin: 4.3 g/dL (ref 3.5–5.0)
Alkaline Phosphatase: 58 U/L (ref 38–126)
Anion gap: 8 (ref 5–15)
BUN: 11 mg/dL (ref 6–20)
CO2: 23 mmol/L (ref 22–32)
Calcium: 9.3 mg/dL (ref 8.9–10.3)
Chloride: 109 mmol/L (ref 98–111)
Creatinine, Ser: 0.84 mg/dL (ref 0.44–1.00)
GFR calc Af Amer: >60 mL/min (ref >60)
GFR calc non Af Amer: >60 mL/min (ref >60)
Glucose, Bld: 95 mg/dL (ref 70–99)
Potassium: 3.7 mmol/L (ref 3.5–5.1)
Sodium: 140 mmol/L (ref 135–145)
Total Bilirubin: 0.6 mg/dL (ref 0.3–1.2)
Total Protein: 8.3 g/dL — ABNORMAL HIGH (ref 6.5–8.1)

## 2018-06-28 LAB — CHLAMYDIA/NGC RT PCR (ARMC ONLY): N gonorrhoeae: NOT DETECTED

## 2018-06-28 LAB — WET PREP, GENITAL
Sperm: NONE SEEN
Trich, Wet Prep: NONE SEEN
Yeast Wet Prep HPF POC: NONE SEEN

## 2018-06-28 LAB — URINALYSIS, COMPLETE (UACMP) WITH MICROSCOPIC
Bilirubin Urine: NEGATIVE
Glucose, UA: NEGATIVE mg/dL
Ketones, ur: NEGATIVE mg/dL
Leukocytes,Ua: NEGATIVE
Nitrite: NEGATIVE
Protein, ur: NEGATIVE mg/dL
Specific Gravity, Urine: 1.029 (ref 1.005–1.030)
pH: 5 (ref 5.0–8.0)

## 2018-06-28 LAB — CBC
HCT: 38.9 % (ref 36.0–46.0)
Hemoglobin: 12.8 g/dL (ref 12.0–15.0)
MCH: 29.2 pg (ref 26.0–34.0)
MCHC: 32.9 g/dL (ref 30.0–36.0)
MCV: 88.6 fL (ref 80.0–100.0)
Platelets: 273 10*3/uL (ref 150–400)
RBC: 4.39 MIL/uL (ref 3.87–5.11)
RDW: 13.9 % (ref 11.5–15.5)
WBC: 6.7 10*3/uL (ref 4.0–10.5)
nRBC: 0 % (ref 0.0–0.2)

## 2018-06-28 LAB — LIPASE, BLOOD: Lipase: 24 U/L (ref 11–51)

## 2018-06-28 LAB — POCT PREGNANCY, URINE: Preg Test, Ur: NEGATIVE

## 2018-06-28 LAB — CHLAMYDIA/NGC RT PCR (ARMC ONLY)??????????: Chlamydia Tr: NOT DETECTED

## 2018-06-28 MED ORDER — METRONIDAZOLE 500 MG PO TABS: 500.0000 mg | ORAL_TABLET | Freq: Two times a day (BID) | ORAL | 0 refills | Status: AC

## 2018-06-28 MED ORDER — OMEPRAZOLE 10 MG PO CPDR: 10.0000 mg | DELAYED_RELEASE_CAPSULE | Freq: Every day | ORAL | 0 refills | Status: DC

## 2018-06-28 MED ORDER — ONDANSETRON HCL 4 MG PO TABS: 4.0000 mg | ORAL_TABLET | Freq: Every day | ORAL | 0 refills | Status: AC | PRN

## 2018-06-28 MED ORDER — ONDANSETRON 4 MG PO TBDP
4.0000 mg | ORAL_TABLET | Freq: Once | ORAL | Status: AC
  Administered 2018-06-28: 4 mg via ORAL
  Filled 2018-06-28: qty 1

## 2018-06-28 NOTE — ED Provider Notes (Signed)
[facility] Emergency Department Provider Note  ____________________________________________  Time seen: Approximately 12:16 PM  I have reviewed the triage vital signs and the nursing notes.   HISTORY  Chief Complaint Nausea    HPI Ann Padilla is a 28 y.o. female that presents emergency department for evaluation of nausea for 3 days.  Patient states that nausea is somewhat relieved with eating.  She has not noticed any specific foods that make it better or worse.  She has a history of acid reflux and has not been taking any of these medications.  She took a dose of Zofran yesterday, which helped.  Nausea returned this morning.  She went off of her menstrual cycle 3 days ago.  She states that her vaginal discharge smells like bacterial vaginosis, which she has had several times in the past.  Patient states that she has been told before that she is anemic.  She eats cornstarch daily for 2 years, which she states is out of habit.  Prior to eating cornstarch daily, she was eating baking soda and baking powder daily.  She is eating the same amount of cornstarch daily that she usually eats.  She states that she does not drink any water because she does not like the taste and only drinks soda.  She has been trying to increase her water intake the last couple of days. No shortness of breath, chest pain, vomiting, abdominal pain, diarrhea, urinary symptoms.  History reviewed. No pertinent past medical history.  There are no active problems to display for this patient.   Past Surgical History:  Procedure Laterality Date  . CESAREAN SECTION      Prior to Admission medications   Medication Sig Start Date End Date Taking? Authorizing Provider  ondansetron (ZOFRAN-ODT) 8 MG disintegrating tablet Take 8 mg by mouth every 8 (eight) hours as needed for nausea or vomiting.   Yes [provider]  metroNIDAZOLE (FLAGYL) 500 MG tablet Take 1 tablet (500 mg total) by mouth  2 (two) times daily for 7 days. 06/28/18 07/05/18  Enid Derry, PA-C  omeprazole (PRILOSEC) 10 MG capsule Take 1 capsule (10 mg total) by mouth daily. 06/28/18 06/28/19  Enid Derry, PA-C  ondansetron (ZOFRAN) 4 MG tablet Take 1 tablet (4 mg total) by mouth daily as needed for nausea or vomiting. 06/28/18 06/28/19  Enid Derry, PA-C    Allergies Patient has no known allergies.  No family history on file.  Social History Social History   Tobacco Use  . Smoking status: Current Every Day Smoker    Types: Cigars  . Smokeless tobacco: Never Used  Substance Use Topics  . Alcohol use: No  . Drug use: No     Review of Systems  Constitutional: No fever/chills Cardiovascular: No chest pain. Respiratory: No SOB. Gastrointestinal: No abdominal pain.  No vomiting.  Positive for nausea. Genitourinary: Negative for dysuria. Musculoskeletal: Negative for musculoskeletal pain. Skin: Negative for rash, abrasions, lacerations, ecchymosis.   ____________________________________________   PHYSICAL EXAM:  VITAL SIGNS: ED Triage Vitals  Enc Vitals Group     BP 06/28/18 1036 125/72     Pulse Rate 06/28/18 1036 80     Resp 06/28/18 1036 18     Temp 06/28/18 1036 98 F (36.7 C)     Temp Source 06/28/18 1036 Oral     SpO2 06/28/18 1036 98 %     Weight 06/28/18 1037 147 lb (66.7 kg)     Height 06/28/18 1037 5' 2.5" (1.588 m)  Head Circumference --      Peak Flow --      Pain Score 06/28/18 1037 0     Pain Loc --      Pain Edu? --      Excl. in GC? --      Constitutional: Alert and oriented. Well appearing and in no acute distress. Eyes: Conjunctivae are normal. PERRL. EOMI. Head: Atraumatic. ENT:      Ears:      Nose: No congestion/rhinnorhea.      Mouth/Throat: Mucous membranes are moist.  Neck: No stridor.  Cardiovascular: Normal rate, regular rhythm.  Good peripheral circulation. Respiratory: Normal respiratory effort without tachypnea or retractions. Lungs CTAB. Good  air entry to the bases with no decreased or absent breath sounds. Gastrointestinal: Bowel sounds 4 quadrants.  Minimal epigastric discomfort with palpation. No guarding or rigidity. No palpable masses. No distention.  Genitourinary: Health tech Nichole present for genital exam.  No external rashes or lesions seen.  Thin white vaginal discharge in canal. Musculoskeletal: Full range of motion to all extremities. No gross deformities appreciated. Neurologic:  Normal speech and language. No gross focal neurologic deficits are appreciated.  Skin:  Skin is warm, dry and intact. No rash noted. Psychiatric: Mood and affect are normal. Speech and behavior are normal. Patient exhibits appropriate insight and judgement.   ____________________________________________   LABS (all labs ordered are listed, but only abnormal results are displayed)  Labs Reviewed  WET PREP, GENITAL - Abnormal; Notable for the following components:      Result Value   Clue Cells Wet Prep HPF POC PRESENT (*)    WBC, Wet Prep HPF POC FEW (*)    All other components within normal limits  COMPREHENSIVE METABOLIC PANEL - Abnormal; Notable for the following components:   Total Protein 8.3 (*)    All other components within normal limits  URINALYSIS, COMPLETE (UACMP) WITH MICROSCOPIC - Abnormal; Notable for the following components:   Color, Urine YELLOW (*)    APPearance HAZY (*)    Hgb urine dipstick SMALL (*)    Bacteria, UA RARE (*)    All other components within normal limits  CHLAMYDIA/NGC RT PCR ([facility] ONLY)  CBC  LIPASE, BLOOD  POC URINE PREG, ED  POCT PREGNANCY, URINE   ____________________________________________  EKG   ____________________________________________  RADIOLOGY  No results found.  ____________________________________________    PROCEDURES  Procedure(s) performed:    Procedures    Medications  ondansetron (ZOFRAN-ODT) disintegrating tablet 4 mg (4 mg Oral Given  06/28/18 1109)     ____________________________________________   INITIAL IMPRESSION / ASSESSMENT AND PLAN / ED COURSE  Pertinent labs & imaging results that were available during my care of the patient were reviewed by me and considered in my medical decision making (see chart for details).  Review of the [place] CSRS was performed in accordance of the [facility] prior to dispensing any controlled drugs.     Patient presented to emergency department for evaluation of nausea for 3 days.  Lab work is all reassuring.  Urine shows some rare bacteria and will be sent for culture.  She denies any urinary symptoms.  Wet prep consistent with bacterial vaginosis.  Symptoms are likely related to her acid reflux.  She was restarted on PPI.  Symptoms resolved with Zofran.  No abdominal pain.  Patient will be discharged home with prescriptions for omeprazole, Zofran, Flagyl. Patient is to follow up with primary care as directed.  She is requesting referral to  primary care here as [facility]  [facility]  Patient is given ED precautions to return to the ED for any worsening or new symptoms.     ____________________________________________  FINAL CLINICAL IMPRESSION(S) / ED DIAGNOSES  Final diagnoses:  Nausea  Bacterial vaginosis  Gastroesophageal reflux disease without esophagitis      NEW MEDICATIONS STARTED DURING THIS VISIT:  ED Discharge Orders         Ordered    omeprazole (PRILOSEC) 10 MG capsule  Daily     06/28/18 1326    metroNIDAZOLE (FLAGYL) 500 MG tablet  2 times daily     06/28/18 1326    ondansetron (ZOFRAN) 4 MG tablet  Daily PRN     06/28/18 1326              This chart was dictated using voice recognition software/Dragon. Despite best efforts to proofread, errors can occur which can change the meaning. Any change was purely unintentional.    Enid DerryWagner, Tyquan Carmickle, PA-C 06/28/18 1458    Jene EveryKinner, Robert, MD 06/28/18  [phone]

## 2018-06-28 NOTE — ED Triage Notes (Addendum)
States nauseated x3 days. Denies abdominal pain. States period ended x 3 days ago also. Denies fever or cough.

## 2018-06-28 NOTE — ED Notes (Signed)
Says she has nausea for 3 days.   No pain, no vomiting, no diarrhea.  Says the nausea is relieved some after eating.  Says she took a zofran last night and it helped, but nausea was there this am again.

## 2018-06-29 LAB — URINE CULTURE

## 2018-10-19 ENCOUNTER — Other Ambulatory Visit: Payer: Self-pay

## 2018-10-19 ENCOUNTER — Other Ambulatory Visit (LOCAL_COMMUNITY_HEALTH_CENTER): Payer: Self-pay

## 2018-10-19 DIAGNOSIS — Z111 Encounter for screening for respiratory tuberculosis: Secondary | ICD-10-CM

## 2018-10-19 NOTE — Progress Notes (Signed)
Client asked to not use cell phone during history portion of visit and she complied with request. Shona Needles, RN

## 2018-10-22 ENCOUNTER — Ambulatory Visit (LOCAL_COMMUNITY_HEALTH_CENTER): Payer: Medicaid Other

## 2018-10-22 ENCOUNTER — Other Ambulatory Visit: Payer: Self-pay

## 2018-10-22 DIAGNOSIS — Z111 Encounter for screening for respiratory tuberculosis: Secondary | ICD-10-CM

## 2018-10-22 LAB — TB SKIN TEST
Induration: 0 mm
TB Skin Test: NEGATIVE

## 2018-11-09 ENCOUNTER — Telehealth: Payer: Self-pay | Admitting: General Practice

## 2018-11-29 IMAGING — CR DG CHEST 2V
2 series · 2 of 2 positions shown · non-contrast
Comparison: None.

CLINICAL DATA: Sensation of fast heartbeat with lightheadedness.
History of syncope 2 months ago.

EXAM:
CHEST - 2 VIEW

[chest pa]
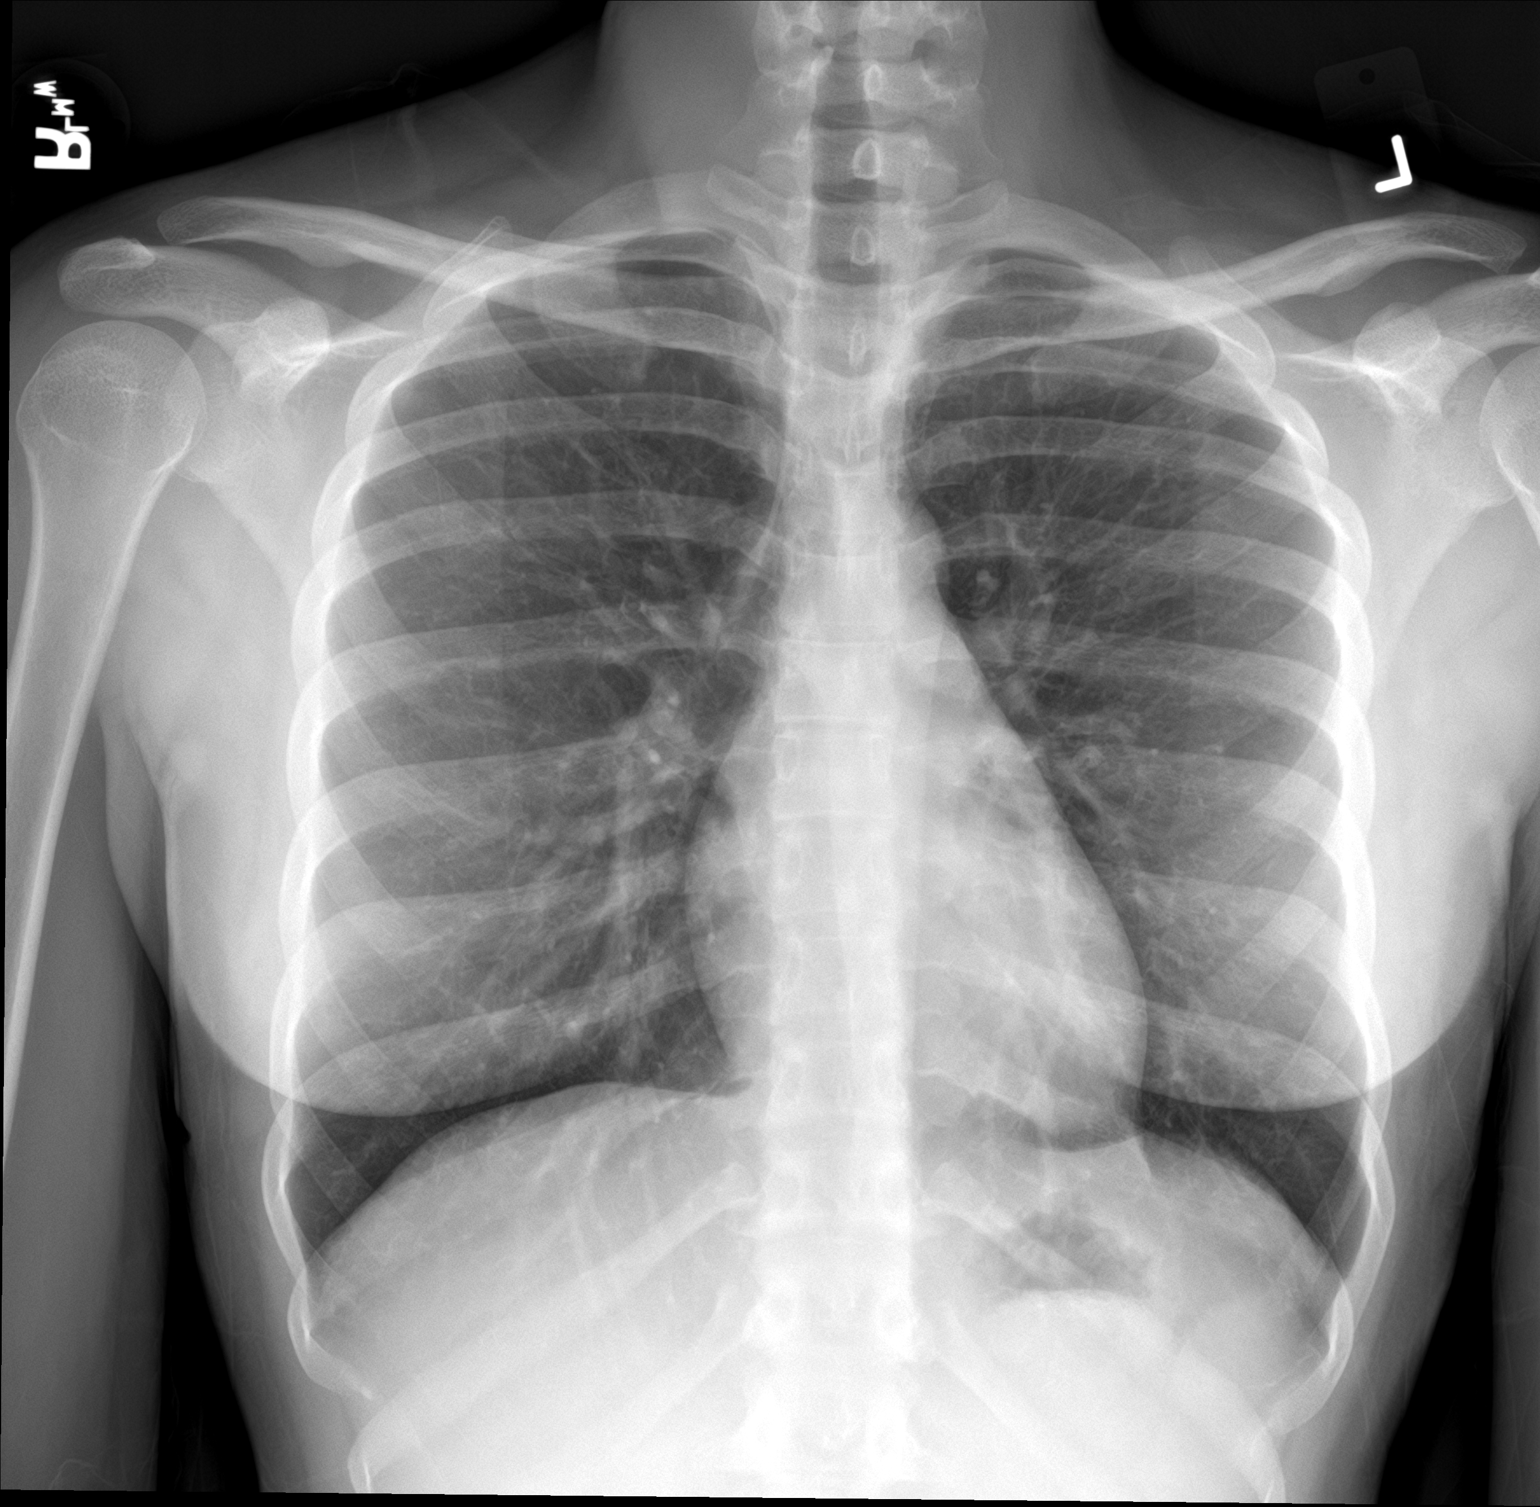

[chest lat]
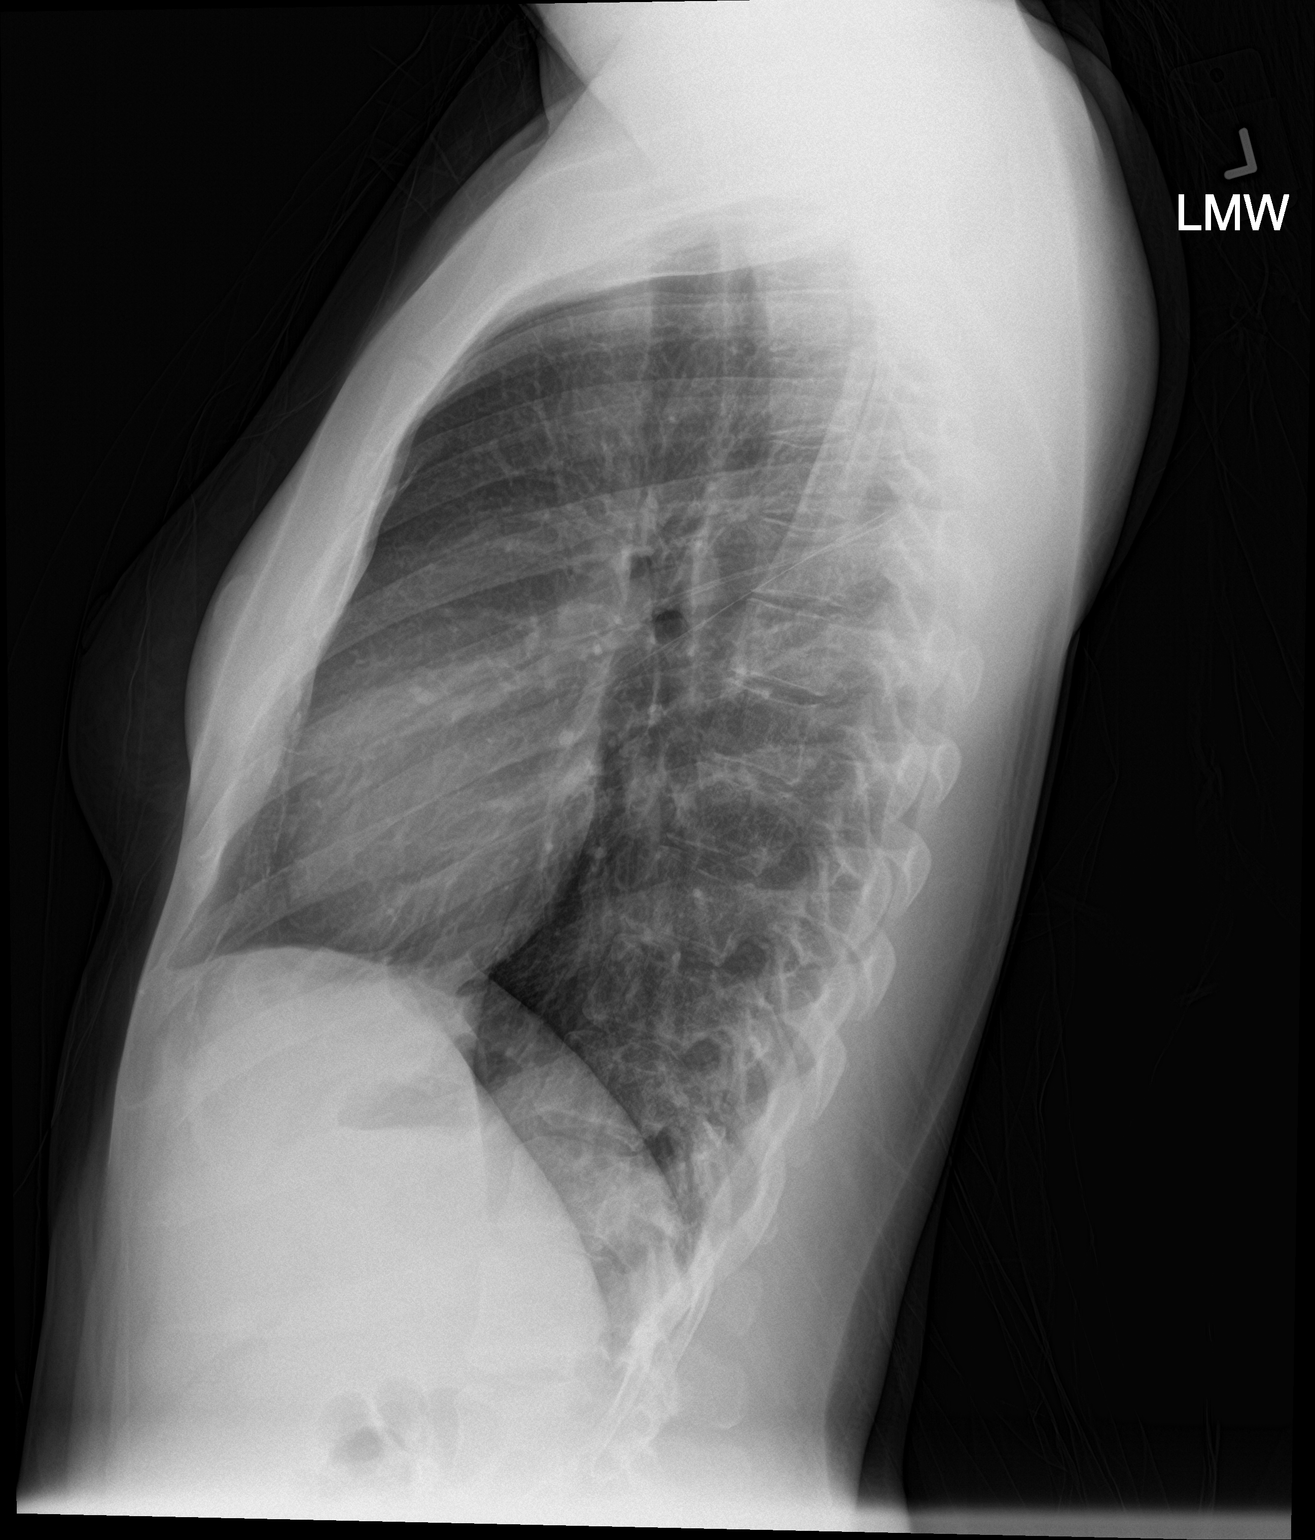

[2 of 2 positions shown; findings below may reference images not displayed]

FINDINGS: The heart size and mediastinal contours are within normal limits.
Both lungs are clear. The visualized skeletal structures are
unremarkable.
IMPRESSION: No active cardiopulmonary disease.

## 2019-06-29 NOTE — Telephone Encounter (Signed)
Encounter opened in error

## 2019-12-01 ENCOUNTER — Other Ambulatory Visit: Payer: Self-pay

## 2019-12-01 ENCOUNTER — Emergency Department
Admission: EM | Admit: 2019-12-01 | Discharge: 2019-12-01 | Disposition: A | Discharge status: Home / Self Care | Payer: Medicaid Other | Attending: Student in an Organized Health Care Education/Training Program | Admitting: Student in an Organized Health Care Education/Training Program

## 2019-12-01 DIAGNOSIS — T7840XA Allergy, unspecified, initial encounter: Secondary | ICD-10-CM | POA: Diagnosis present

## 2019-12-01 DIAGNOSIS — T782XXA Anaphylactic shock, unspecified, initial encounter: Secondary | ICD-10-CM | POA: Diagnosis not present

## 2019-12-01 MED ORDER — EPINEPHRINE 0.3 MG/0.3ML IJ SOAJ: 0.3000 mg | INTRAMUSCULAR | 1 refills | Status: DC | PRN

## 2019-12-01 MED ORDER — PREDNISONE 20 MG PO TABS: 40.0000 mg | ORAL_TABLET | Freq: Every day | ORAL | 0 refills | Status: AC

## 2019-12-01 NOTE — Discharge Instructions (Addendum)
Please seek medical attention for any high fevers, chest pain, shortness of breath, change in behavior, persistent vomiting, bloody stool or any other new or concerning symptoms.  

## 2019-12-01 NOTE — ED Triage Notes (Signed)
Pt arrived via [facility] from home with an allergic reaction to a yellow jacket sting to her back. Pt has hives to back and chest and a swollen tongue but otherwise still able to breathe and talk. Pt took 0.3 of her own epi and was given another 0.3 of epi, 50 of benadryl, 125 of solumedrol, and 40 of famotidine by ems. Pt lethargic but awakens to voice.

## 2019-12-01 NOTE — ED Notes (Signed)
Patient discharged home with boyfriend, patient received discharge papers and prescriptions. Patient appropriate and cooperative. Vital signs taken. NAD noted.

## 2019-12-01 NOTE — ED Notes (Signed)
Pt had peripheral IV in right hand. Placed by EMS. Removed intact, clean, and dry by RN Mimi at discharge

## 2019-12-01 NOTE — ED Notes (Signed)
Patient appropriate and cooperative. NAD noted. Denies pain

## 2019-12-01 NOTE — ED Provider Notes (Signed)
[facility] Emergency Department Provider Note    First MD Initiated Contact with Patient 12/01/19 1358     (approximate)  I have reviewed the triage vital signs and the nursing notes.   HISTORY  Chief Complaint Allergic Reaction    HPI Ann Padilla is a 29 y.o. female presents to the ER via EMS with evidence of anaphylactic reaction. Patient was stung by yellow jacket and she was given something to eat salt fine to the car and stood on her back. Did have diffuse hives reportedly had some tongue swelling shortness of breath was given epi, Benadryl, Pepcid, Solu-Medrol with improvement in symptoms. Patient denies any history of anaphylaxis or allergic reaction.    History reviewed. No pertinent past medical history. History reviewed. No pertinent family history. Past Surgical History:  Procedure Laterality Date  . CESAREAN SECTION     There are no problems to display for this patient.     Prior to Admission medications   Medication Sig Start Date End Date Taking? Authorizing Provider  omeprazole (PRILOSEC) 10 MG capsule Take 1 capsule (10 mg total) by mouth daily. 06/28/18 06/28/19  Enid Derry, PA-C  ondansetron (ZOFRAN-ODT) 8 MG disintegrating tablet Take 8 mg by mouth every 8 (eight) hours as needed for nausea or vomiting.    [provider]    Allergies Yellow jacket venom [bee venom]    Social History Social History   Tobacco Use  . Smoking status: Current Every Day Smoker    Types: Cigars  . Smokeless tobacco: Never Used  Substance Use Topics  . Alcohol use: No  . Drug use: No    Review of Systems Patient denies headaches, rhinorrhea, blurry vision, numbness, shortness of breath, chest pain, edema, cough, abdominal pain, nausea, vomiting, diarrhea, dysuria, fevers, rashes or hallucinations unless otherwise stated above in HPI. ____________________________________________   PHYSICAL EXAM:  VITAL SIGNS: Vitals:    12/01/19 1354  BP: 133/87  Pulse: 84  Resp: (!) 24  Temp: 97.6 F (36.4 C)  SpO2: 100%    Constitutional: Alert and oriented.  Eyes: Conjunctivae are normal.  Head: Atraumatic. Nose: No congestion/rhinnorhea. Mouth/Throat: Mucous membranes are moist.   Neck: No stridor. Painless ROM.  Cardiovascular: Normal rate, regular rhythm. Grossly normal heart sounds.  Good peripheral circulation. Respiratory: Normal respiratory effort.  No retractions. Lungs CTAB. Gastrointestinal: Soft and nontender. No distention. No abdominal bruits. No CVA tenderness. Genitourinary:  Musculoskeletal: No lower extremity tenderness nor edema.  No joint effusions. Neurologic:  Normal speech and language. No gross focal neurologic deficits are appreciated. No facial droop Skin:  Skin is warm, dry and intact. No rash noted. Psychiatric: Mood and affect are normal. Speech and behavior are normal.  ____________________________________________   LABS (all labs ordered are listed, but only abnormal results are displayed)  No results found for this or any previous visit (from the past 24 hour(s)). ____________________________________________ ____________________________________________  RADIOLOGY   ____________________________________________   PROCEDURES  Procedure(s) performed:  Procedures    Critical Care performed: no ____________________________________________   INITIAL IMPRESSION / ASSESSMENT AND PLAN / ED COURSE  Pertinent labs & imaging results that were available during my care of the patient were reviewed by me and considered in my medical decision making (see chart for details).   DDX: anaphylaxis, anaphylactoid, urticaria,  Ann Padilla is a 29 y.o. who presents to the ED with evidence of anaphylactic reaction treated appropriately by EMS with epinephrine as well as H1, H2 blockers and steroid. Patient  protecting her airway right now. No sign of uvular edema no tongue swelling.  Urticaria is resolved. Will be observed on monitor for 3 hours to ensure she does not have any rebound reaction.     The patient was evaluated in Emergency Department today for the symptoms described in the history of present illness. He/she was evaluated in the context of the global COVID-19 pandemic, which necessitated consideration that the patient might be at risk for infection with the SARS-CoV-2 virus that causes COVID-19. Institutional protocols and algorithms that pertain to the evaluation of patients at risk for COVID-19 are in a state of rapid change based on information released by regulatory bodies including the CDC and federal and state organizations. These policies and algorithms were followed during the patient's care in the ED.  As part of my medical decision making, I reviewed the following data within the electronic MEDICAL RECORD NUMBER Nursing notes reviewed and incorporated, Labs reviewed, notes from prior ED visits and [place] Controlled Substance Database   ____________________________________________   FINAL CLINICAL IMPRESSION(S) / ED DIAGNOSES  Final diagnoses:  Anaphylaxis, initial encounter      NEW MEDICATIONS STARTED DURING THIS VISIT:  New Prescriptions   No medications on file     Note:  This document was prepared using Dragon voice recognition software and may include unintentional dictation errors.    Willy Eddy, MD 12/01/19 [phone]

## 2020-08-27 ENCOUNTER — Emergency Department
Admission: EM | Admit: 2020-08-27 | Discharge: 2020-08-27 | Disposition: A | Discharge status: Home / Self Care | Payer: Medicaid Other | Attending: Emergency Medicine | Admitting: Emergency Medicine

## 2020-08-27 ENCOUNTER — Emergency Department: Payer: Medicaid Other

## 2020-08-27 ENCOUNTER — Other Ambulatory Visit: Payer: Self-pay

## 2020-08-27 ENCOUNTER — Telehealth: Payer: Self-pay | Admitting: Student

## 2020-08-27 DIAGNOSIS — F17290 Nicotine dependence, other tobacco product, uncomplicated: Secondary | ICD-10-CM | POA: Insufficient documentation

## 2020-08-27 DIAGNOSIS — R300 Dysuria: Secondary | ICD-10-CM | POA: Diagnosis present

## 2020-08-27 DIAGNOSIS — N390 Urinary tract infection, site not specified: Secondary | ICD-10-CM | POA: Insufficient documentation

## 2020-08-27 LAB — URINALYSIS, COMPLETE (UACMP) WITH MICROSCOPIC
Bilirubin Urine: NEGATIVE
Glucose, UA: NEGATIVE mg/dL
Ketones, ur: NEGATIVE mg/dL
Nitrite: NEGATIVE
Protein, ur: 100 mg/dL — AB
RBC / HPF: >50 RBC/hpf — ABNORMAL HIGH (ref 0–5)
Specific Gravity, Urine: 1.026 (ref 1.005–1.030)
WBC, UA: >50 WBC/hpf — ABNORMAL HIGH (ref 0–5)
pH: 6 (ref 5.0–8.0)

## 2020-08-27 LAB — WET PREP, GENITAL
Sperm: NONE SEEN
Trich, Wet Prep: NONE SEEN
Yeast Wet Prep HPF POC: NONE SEEN

## 2020-08-27 LAB — CHLAMYDIA/NGC RT PCR (ARMC ONLY)
Chlamydia Tr: NOT DETECTED
N gonorrhoeae: NOT DETECTED

## 2020-08-27 LAB — POC URINE PREG, ED: Preg Test, Ur: NEGATIVE

## 2020-08-27 MED ORDER — METRONIDAZOLE 500 MG PO TABS: 500.0000 mg | ORAL_TABLET | Freq: Two times a day (BID) | ORAL | 0 refills | Status: AC

## 2020-08-27 MED ORDER — SULFAMETHOXAZOLE-TRIMETHOPRIM 800-160 MG PO TABS
1.0000 | ORAL_TABLET | Freq: Once | ORAL | Status: AC
  Administered 2020-08-27: 1 via ORAL
  Filled 2020-08-27: qty 1

## 2020-08-27 MED ORDER — SULFAMETHOXAZOLE-TRIMETHOPRIM 800-160 MG PO TABS: 1.0000 | ORAL_TABLET | Freq: Two times a day (BID) | ORAL | 0 refills | Status: AC

## 2020-08-27 MED ORDER — PHENAZOPYRIDINE HCL 100 MG PO TABS: 100.0000 mg | ORAL_TABLET | Freq: Three times a day (TID) | ORAL | 0 refills | Status: AC | PRN

## 2020-08-27 NOTE — ED Provider Notes (Signed)
[facility] Emergency Department Provider Note  ____________________________________________   Event Date/Time   First MD Initiated Contact with Patient 08/27/20 1055     (approximate)  I have reviewed the triage vital signs and the nursing notes.   HISTORY  Chief Complaint Dysuria   HPI Ann Padilla is a 30 y.o. female who reports to the emergency department for evaluation of dysuria with associated low back pain that began yesterday morning.  She denies any fevers, nausea, vomiting, abdominal pain.  She denies any concern for possible STD.         No past medical history on file.  There are no problems to display for this patient.   Past Surgical History:  Procedure Laterality Date   CESAREAN SECTION      Prior to Admission medications   Medication Sig Start Date End Date Taking? Authorizing Provider  phenazopyridine (PYRIDIUM) 100 MG tablet Take 1 tablet (100 mg total) by mouth 3 (three) times daily as needed for pain. 08/27/20 08/27/21 Yes Dorothyann Mourer, Ruben Gottron, PA  sulfamethoxazole-trimethoprim (BACTRIM DS) 800-160 MG tablet Take 1 tablet by mouth 2 (two) times daily for 7 days. 08/27/20 09/03/20 Yes Lucy Chris, PA  EPINEPHrine 0.3 mg/0.3 mL IJ SOAJ injection Inject 0.3 mg into the muscle as needed for anaphylaxis. 12/01/19   Willy Eddy, MD  metroNIDAZOLE (FLAGYL) 500 MG tablet Take 1 tablet (500 mg total) by mouth 2 (two) times daily for 7 days. 08/27/20 09/03/20  Lucy Chris, PA  omeprazole (PRILOSEC) 10 MG capsule Take 1 capsule (10 mg total) by mouth daily. Patient not taking: Reported on 12/01/2019 06/28/18 06/28/19  Enid Derry, PA-C    Allergies Yellow jacket venom [bee venom]  No family history on file.  Social History Social History   Tobacco Use   Smoking status: Every Day    Pack years: 0.00    Types: Cigars   Smokeless tobacco: Never  Substance Use Topics   Alcohol use: No   Drug use: No    Review  of Systems  Constitutional: No fever/chills Eyes: No visual changes. ENT: No sore throat. Cardiovascular: Denies chest pain. Respiratory: Denies shortness of breath. Gastrointestinal: No abdominal pain.  No nausea, no vomiting.  No diarrhea.  No constipation. Genitourinary: + dysuria. Musculoskeletal: + for back pain. Skin: Negative for rash. Neurological: Negative for headaches, focal weakness or numbness.   ____________________________________________   PHYSICAL EXAM:  VITAL SIGNS: ED Triage Vitals  Enc Vitals Group     BP 08/27/20 0914 112/79     Pulse Rate 08/27/20 0914 96     Resp 08/27/20 0914 18     Temp 08/27/20 0914 98.6 F (37 C)     Temp Source 08/27/20 0914 Oral     SpO2 08/27/20 0914 99 %     Weight 08/27/20 0915 150 lb (68 kg)     Height 08/27/20 0915 5\' 2"  (1.575 m)     Head Circumference --      Peak Flow --      Pain Score 08/27/20 0914 5     Pain Loc --      Pain Edu? --      Excl. in GC? --    Constitutional: Alert and oriented. Well appearing and in no acute distress. Eyes: Conjunctivae are normal. PERRL. EOMI. Head: Atraumatic. Nose: No congestion/rhinnorhea. Mouth/Throat: Mucous membranes are moist.  Oropharynx non-erythematous. Neck: No stridor.   Cardiovascular: Normal rate, regular rhythm. Grossly normal heart sounds.  Good  peripheral circulation. Respiratory: Normal respiratory effort.  No retractions. Lungs CTAB. Gastrointestinal: Soft and nontender.  Specifically no suprapubic tenderness.  No distention. No abdominal bruits. No CVA tenderness to percussion bilaterally. Musculoskeletal: No tenderness to palpation of the midline or paraspinals of the lumbar spine.  Full range of motion of bilateral lower extremities without difficulty.  Dorsal pedal pulse 2+ bilaterally. Neurologic:  Normal speech and language. No gross focal neurologic deficits are appreciated. No gait instability. Skin:  Skin is warm, dry and intact. No rash  noted. Psychiatric: Mood and affect are normal. Speech and behavior are normal.  ____________________________________________   LABS (all labs ordered are listed, but only abnormal results are displayed)  Labs Reviewed  WET PREP, GENITAL - Abnormal; Notable for the following components:      Result Value   Clue Cells Wet Prep HPF POC PRESENT (*)    WBC, Wet Prep HPF POC FEW (*)    All other components within normal limits  URINALYSIS, COMPLETE (UACMP) WITH MICROSCOPIC - Abnormal; Notable for the following components:   Color, Urine YELLOW (*)    APPearance CLOUDY (*)    Hgb urine dipstick LARGE (*)    Protein, ur 100 (*)    Leukocytes,Ua LARGE (*)    RBC / HPF >50 (*)    WBC, UA >50 (*)    Bacteria, UA FEW (*)    Non Squamous Epithelial PRESENT (*)    All other components within normal limits  CHLAMYDIA/NGC RT PCR ([facility] ONLY)            URINE CULTURE  POC URINE PREG, ED    ____________________________________________  RADIOLOGY  Official radiology report(s): CT Renal Stone Study  Result Date: 08/27/2020 CLINICAL DATA:  Flank pain with painful urination EXAM: CT ABDOMEN AND PELVIS WITHOUT CONTRAST TECHNIQUE: Multidetector CT imaging of the abdomen and pelvis was performed following the standard protocol without IV contrast. COMPARISON:  None. FINDINGS: Lower chest: No acute abnormality. Normal size heart. No significant pericardial effusion/thickening. Hepatobiliary: Unremarkable noncontrast appearance of the hepatic parenchyma. Gallbladder is unremarkable. No biliary ductal dilation. Pancreas: Within normal limits for noncontrast examination. Spleen: Within normal limits. Adrenals/Urinary Tract: Adrenal glands are unremarkable. Kidneys are normal, without renal calculi, contour deforming lesion, or hydronephrosis. Mild diffuse bladder wall thickening with pericystic stranding. Stomach/Bowel: Stomach is within normal limits. Appendix appears normal. No evidence of bowel wall  thickening, distention, or inflammatory changes. Vascular/Lymphatic: No significant vascular findings are present. No enlarged abdominal or pelvic lymph nodes. Reproductive: Uterus and bilateral adnexa are unremarkable. Other: Trace pelvic free fluid. Sequela of subcutaneous injection in the right posterior gluteal subcutaneous soft tissues. Musculoskeletal: No acute or significant osseous findings. IMPRESSION: 1. Mild diffuse bladder wall thickening with pericystic stranding, suggestive of cystitis. Correlate with urinalysis. 2. No renal, ureteral or bladder calculi visualized. 3. Trace pelvic free fluid, commonly physiologic Electronically Signed   By: Maudry Mayhew MD   On: 08/27/2020 11:40      ____________________________________________   INITIAL IMPRESSION / ASSESSMENT AND PLAN / ED COURSE  As part of my medical decision making, I reviewed the following data within the electronic MEDICAL RECORD NUMBER Nursing notes reviewed and incorporated, Labs reviewed, and Notes from prior ED visits        Patient is a 30 year old female who reports to the emergency department for evaluation of dysuria and back pain that began yesterday.  See HPI for further details.  In triage, patient is afebrile, has normal vital signs.  On  physical exam, there is no abdominal tenderness to palpation, no CVA tenderness or tenderness to palpation of the musculature of the low back.  Urinalysis was obtained and demonstrates few bacteria with large number of white cells, large number of leukocytes, no nitrites.  There is also non-squamous epithelials present.  CT renal stone study was obtained and demonstrates diffuse bladder wall thickening without any evidence of renal stone or pyelonephritis.  Patient will be initiated on Bactrim.  Before discharge, patient stated that she would like to go ahead and get STD testing, I agreed to call her with results.  She was negative for GC and chlamydia, positive for BV.  I did also  send Flagyl to her pharmacy for her and called to notify her.  Patient is stable for outpatient follow-up, return precautions were discussed.      ____________________________________________   FINAL CLINICAL IMPRESSION(S) / ED DIAGNOSES  Final diagnoses:  Lower urinary tract infectious disease     ED Discharge Orders          Ordered    sulfamethoxazole-trimethoprim (BACTRIM DS) 800-160 MG tablet  2 times daily        08/27/20 1151    phenazopyridine (PYRIDIUM) 100 MG tablet  3 times daily PRN        08/27/20 1205             Note:  This document was prepared using Dragon voice recognition software and may include unintentional dictation errors.    Lucy Chris, PA 08/27/20 1658    Merwyn Katos, MD 08/27/20 [phone]

## 2020-08-27 NOTE — ED Triage Notes (Signed)
Painful urination with low back pain starting Sunday am.

## 2020-08-27 NOTE — Discharge Instructions (Addendum)
I will call with the results of your swabs if you are positive. You will not receive a call if you are negative. Please take the antibiotics prescribed for your UTI. Return to the ER if you develop any fever or worsening of symptoms.

## 2020-08-27 NOTE — Telephone Encounter (Cosign Needed)
.  Patient was called and informed of positive BV results.  Flagyl sent to her pharmacy as requested return precautions again discussed.

## 2020-08-29 LAB — URINE CULTURE: Culture: >=100000 — AB

## 2021-08-31 ENCOUNTER — Encounter (HOSPITAL_COMMUNITY): Payer: Self-pay

## 2021-08-31 ENCOUNTER — Emergency Department (HOSPITAL_COMMUNITY)
Admission: EM | Admit: 2021-08-31 | Discharge: 2021-08-31 | Disposition: A | Discharge status: Home / Self Care | Payer: Medicaid Other | Attending: Emergency Medicine | Admitting: Emergency Medicine

## 2021-08-31 DIAGNOSIS — N898 Other specified noninflammatory disorders of vagina: Secondary | ICD-10-CM

## 2021-08-31 DIAGNOSIS — M545 Low back pain, unspecified: Secondary | ICD-10-CM

## 2021-08-31 LAB — WET PREP, GENITAL
Clue Cells Wet Prep HPF POC: NONE SEEN
Sperm: NONE SEEN
Trich, Wet Prep: NONE SEEN
WBC, Wet Prep HPF POC: <10 (ref <10)
Yeast Wet Prep HPF POC: NONE SEEN

## 2021-08-31 LAB — URINALYSIS, ROUTINE W REFLEX MICROSCOPIC
Bilirubin Urine: NEGATIVE
Glucose, UA: NEGATIVE mg/dL
Hgb urine dipstick: NEGATIVE
Ketones, ur: NEGATIVE mg/dL
Nitrite: NEGATIVE
Protein, ur: NEGATIVE mg/dL
Specific Gravity, Urine: 1.03 (ref 1.005–1.030)
pH: 5 (ref 5.0–8.0)

## 2021-08-31 NOTE — ED Triage Notes (Signed)
Pt presents with c/o lower back pain on both sides that started last night. Pt denies any injury or fall. Pt concerned for a bladder infection or UTI but denies any burning with urination or hematuria.

## 2021-09-20 ENCOUNTER — Encounter (HOSPITAL_COMMUNITY): Payer: Self-pay

## 2021-09-20 ENCOUNTER — Emergency Department (HOSPITAL_COMMUNITY)
Admission: EM | Admit: 2021-09-20 | Discharge: 2021-09-20 | Disposition: A | Discharge status: Home / Self Care | Payer: Medicaid Other | Attending: Emergency Medicine | Admitting: Emergency Medicine

## 2021-09-20 ENCOUNTER — Emergency Department (HOSPITAL_COMMUNITY): Payer: Medicaid Other

## 2021-09-20 DIAGNOSIS — T782XXA Anaphylactic shock, unspecified, initial encounter: Secondary | ICD-10-CM | POA: Insufficient documentation

## 2021-09-20 DIAGNOSIS — T7840XA Allergy, unspecified, initial encounter: Secondary | ICD-10-CM | POA: Diagnosis present

## 2021-09-20 LAB — I-STAT BETA HCG BLOOD, ED (MC, WL, AP ONLY): I-stat hCG, quantitative: <5 m[IU]/mL (ref <5)

## 2021-09-20 LAB — CBC WITH DIFFERENTIAL/PLATELET
Abs Immature Granulocytes: 0.08 10*3/uL — ABNORMAL HIGH (ref 0.00–0.07)
Basophils Absolute: 0 10*3/uL (ref 0.0–0.1)
Basophils Relative: 0 %
Eosinophils Absolute: 0 10*3/uL (ref 0.0–0.5)
Eosinophils Relative: 0 %
HCT: 37.4 % (ref 36.0–46.0)
Hemoglobin: 11.5 g/dL — ABNORMAL LOW (ref 12.0–15.0)
Immature Granulocytes: 1 %
Lymphocytes Relative: 19 %
Lymphs Abs: 2.6 10*3/uL (ref 0.7–4.0)
MCH: 26.7 pg (ref 26.0–34.0)
MCHC: 30.7 g/dL (ref 30.0–36.0)
MCV: 86.8 fL (ref 80.0–100.0)
Monocytes Absolute: 0.6 10*3/uL (ref 0.1–1.0)
Monocytes Relative: 4 %
Neutro Abs: 10.4 10*3/uL — ABNORMAL HIGH (ref 1.7–7.7)
Neutrophils Relative %: 76 %
Platelets: 364 10*3/uL (ref 150–400)
RBC: 4.31 MIL/uL (ref 3.87–5.11)
RDW: 15.8 % — ABNORMAL HIGH (ref 11.5–15.5)
WBC: 13.7 10*3/uL — ABNORMAL HIGH (ref 4.0–10.5)
nRBC: 0 % (ref 0.0–0.2)

## 2021-09-20 LAB — BASIC METABOLIC PANEL
Anion gap: 9 (ref 5–15)
BUN: 12 mg/dL (ref 6–20)
CO2: 21 mmol/L — ABNORMAL LOW (ref 22–32)
Calcium: 9 mg/dL (ref 8.9–10.3)
Chloride: 109 mmol/L (ref 98–111)
Creatinine, Ser: 0.94 mg/dL (ref 0.44–1.00)
GFR, Estimated: >60 mL/min (ref >60)
Glucose, Bld: 159 mg/dL — ABNORMAL HIGH (ref 70–99)
Potassium: 2.8 mmol/L — ABNORMAL LOW (ref 3.5–5.1)
Sodium: 139 mmol/L (ref 135–145)

## 2021-09-20 MED ORDER — METHYLPREDNISOLONE SODIUM SUCC 125 MG IJ SOLR
125.0000 mg | Freq: Once | INTRAMUSCULAR | Status: AC
  Administered 2021-09-20: 125 mg via INTRAVENOUS
  Filled 2021-09-20: qty 2

## 2021-09-20 MED ORDER — PREDNISONE 50 MG PO TABS: ORAL_TABLET | ORAL | 0 refills | Status: DC

## 2021-09-20 MED ORDER — EPINEPHRINE 0.3 MG/0.3ML IJ SOAJ: 0.3000 mg | INTRAMUSCULAR | 0 refills | Status: DC | PRN

## 2021-09-20 MED ORDER — FAMOTIDINE 20 MG PO TABS: 20.0000 mg | ORAL_TABLET | Freq: Two times a day (BID) | ORAL | 0 refills | Status: DC

## 2021-09-20 MED ORDER — POTASSIUM CHLORIDE CRYS ER 20 MEQ PO TBCR
40.0000 meq | EXTENDED_RELEASE_TABLET | Freq: Once | ORAL | Status: AC
  Administered 2021-09-20: 40 meq via ORAL
  Filled 2021-09-20: qty 2

## 2021-09-20 MED ORDER — STERILE WATER FOR INJECTION IJ SOLN
INTRAMUSCULAR | Status: AC
  Filled 2021-09-20: qty 10

## 2021-09-20 MED ORDER — FAMOTIDINE IN NACL 20-0.9 MG/50ML-% IV SOLN
20.0000 mg | Freq: Once | INTRAVENOUS | Status: AC
  Administered 2021-09-20: 20 mg via INTRAVENOUS
  Filled 2021-09-20: qty 50

## 2021-09-20 MED ORDER — SODIUM CHLORIDE 0.9 % IV BOLUS
1000.0000 mL | Freq: Once | INTRAVENOUS | Status: AC
  Administered 2021-09-20: 1000 mL via INTRAVENOUS

## 2021-09-20 NOTE — Discharge Instructions (Addendum)
Take the steroids and antihistamines as prescribed.  Follow-up with your doctor.  If you use the epinephrine pen, you must come to the hospital afterwards to be observed.  Return to the ED with difficulty breathing, chest pain, not able to eat or drink, tongue swelling, lip swelling, other concerns.
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
## 2021-09-20 NOTE — ED Triage Notes (Signed)
Pt arrived [facility] from work.   Pt was stung by an insect.  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   Pt reports swelling in right cheek, tongue, sweaty, and flushed. Reports vision tunneling. On scene pt was lethargic. EKG showed nothing significant at that time.   Pt gave herself 2 epi pens and per fire they were both expired.  Fire gave 25 mg benadryl tabs.   Nothing given by EMS at that time.   Pt improving.    [facility] EMS: BP-120/60 HR-100 98% on 2 liters 02  CBG-126  A/Ox4

## 2022-06-02 ENCOUNTER — Ambulatory Visit (LOCAL_COMMUNITY_HEALTH_CENTER): Payer: Self-pay

## 2022-06-02 ENCOUNTER — Other Ambulatory Visit: Payer: Medicaid Other

## 2022-06-02 DIAGNOSIS — Z111 Encounter for screening for respiratory tuberculosis: Secondary | ICD-10-CM

## 2022-06-03 ENCOUNTER — Emergency Department: Payer: Medicaid Other

## 2022-06-03 ENCOUNTER — Other Ambulatory Visit: Payer: Self-pay

## 2022-06-03 ENCOUNTER — Emergency Department
Admission: EM | Admit: 2022-06-03 | Discharge: 2022-06-03 | Disposition: A | Discharge status: Home / Self Care | Payer: Medicaid Other | Attending: Student in an Organized Health Care Education/Training Program | Admitting: Student in an Organized Health Care Education/Training Program

## 2022-06-03 DIAGNOSIS — T07XXXA Unspecified multiple injuries, initial encounter: Secondary | ICD-10-CM

## 2022-06-03 DIAGNOSIS — Y92410 Unspecified street and highway as the place of occurrence of the external cause: Secondary | ICD-10-CM | POA: Diagnosis not present

## 2022-06-03 DIAGNOSIS — R519 Headache, unspecified: Secondary | ICD-10-CM | POA: Diagnosis not present

## 2022-06-03 DIAGNOSIS — S161XXA Strain of muscle, fascia and tendon at neck level, initial encounter: Secondary | ICD-10-CM | POA: Insufficient documentation

## 2022-06-03 DIAGNOSIS — S199XXA Unspecified injury of neck, initial encounter: Secondary | ICD-10-CM | POA: Diagnosis present

## 2022-06-03 DIAGNOSIS — M79671 Pain in right foot: Secondary | ICD-10-CM | POA: Diagnosis not present

## 2022-06-03 DIAGNOSIS — S46911A Strain of unspecified muscle, fascia and tendon at shoulder and upper arm level, right arm, initial encounter: Secondary | ICD-10-CM | POA: Diagnosis not present

## 2022-06-03 MED ORDER — MELOXICAM 15 MG PO TABS: 15.0000 mg | ORAL_TABLET | Freq: Every day | ORAL | 2 refills | Status: AC

## 2022-06-03 MED ORDER — BACLOFEN 10 MG PO TABS: 10.0000 mg | ORAL_TABLET | Freq: Three times a day (TID) | ORAL | 0 refills | Status: AC

## 2022-06-03 NOTE — ED Notes (Signed)
Pt taken to xray via w/c with xray techs
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
## 2022-06-03 NOTE — Discharge Instructions (Signed)
  Follow-up with your regular doctor if not improving in 3 days.  Return if worsening.  Take your medications as prescribed.  Apply ice to all areas that hurt.  If you continue to have neck pain, knee pain shoulder pain we can follow-up with orthopedics.  Their number is listed above.

## 2022-06-05 ENCOUNTER — Other Ambulatory Visit: Payer: Medicaid Other

## 2022-07-25 ENCOUNTER — Other Ambulatory Visit: Payer: Medicaid Other

## 2022-10-02 ENCOUNTER — Encounter (HOSPITAL_COMMUNITY): Payer: Self-pay | Admitting: Emergency Medicine

## 2022-10-02 ENCOUNTER — Emergency Department (HOSPITAL_COMMUNITY)
Admission: EM | Admit: 2022-10-02 | Discharge: 2022-10-02 | Discharge status: Against Medical Advice | Payer: Medicaid Other | Attending: Emergency Medicine | Admitting: Emergency Medicine

## 2022-10-02 ENCOUNTER — Other Ambulatory Visit: Payer: Self-pay

## 2022-10-02 DIAGNOSIS — B3731 Acute candidiasis of vulva and vagina: Secondary | ICD-10-CM | POA: Insufficient documentation

## 2022-10-02 DIAGNOSIS — R102 Pelvic and perineal pain: Secondary | ICD-10-CM | POA: Diagnosis present

## 2022-10-02 LAB — WET PREP, GENITAL
Clue Cells Wet Prep HPF POC: NONE SEEN
Sperm: NONE SEEN
Trich, Wet Prep: NONE SEEN
WBC, Wet Prep HPF POC: <10 (ref <10)

## 2022-10-02 MED ORDER — DOXYCYCLINE HYCLATE 100 MG PO TABS: 100.0000 mg | ORAL_TABLET | Freq: Two times a day (BID) | ORAL | 0 refills | Status: DC

## 2022-10-02 MED ORDER — CLOTRIMAZOLE 1 % VA CREA: 1.0000 | TOPICAL_CREAM | Freq: Every day | VAGINAL | 0 refills | Status: DC

## 2022-10-02 MED ORDER — FLUCONAZOLE 150 MG PO TABS
150.0000 mg | ORAL_TABLET | Freq: Once | ORAL | Status: AC
  Administered 2022-10-02: 150 mg via ORAL
  Filled 2022-10-02: qty 1

## 2022-10-02 MED ORDER — LIDOCAINE HCL (PF) 1 % IJ SOLN: 1.0000 mL | Freq: Once | INTRAMUSCULAR | Status: DC

## 2022-10-02 MED ORDER — LIDOCAINE HCL (PF) 1 % IJ SOLN
1.0000 mL | Freq: Once | INTRAMUSCULAR | Status: AC
  Administered 2022-10-02: 1.5 mL

## 2022-10-02 MED ORDER — CEFTRIAXONE SODIUM 500 MG IJ SOLR
500.0000 mg | Freq: Once | INTRAMUSCULAR | Status: AC
  Administered 2022-10-02: 500 mg via INTRAMUSCULAR
  Filled 2022-10-02: qty 500

## 2022-10-02 NOTE — ED Triage Notes (Signed)
Pt states vagina has been swelling and irritated for 3 days after having intercourse. Denies any pain with urination. Pt states she tried putting yogurt on vagina and it helped some with the discomfort but it came back.

## 2022-10-02 NOTE — ED Notes (Signed)
Pt still refusing vitals.  

## 2022-10-02 NOTE — ED Provider Notes (Signed)
[facility] EMERGENCY DEPARTMENT AT [facility] Provider Note   CSN: [id number] Arrival date & time: 10/02/22  1118     History  Chief Complaint  Patient presents with   Groin Swelling    Ann Padilla is a 32 y.o. female otherwise healthy presents for evaluation of vaginal pain and swelling.  Symptoms have persisted for about 3 days after unprotected sexual intercourse with her partner.  She denies any urinary symptoms. She states that in the last few days she has noticed some white thick discharge from her vagina.  Last menstrual cycle was on 09/12/2022.  She denies any fever.  Denies history of other STIs in the past.  HPI   History reviewed. No pertinent past medical history. Past Surgical History:  Procedure Laterality Date   CESAREAN SECTION       Home Medications Prior to Admission medications   Medication Sig Start Date End Date Taking? Authorizing Provider  clotrimazole (GYNE-LOTRIMIN) 1 % vaginal cream Place 1 Applicatorful vaginally at bedtime. 10/02/22  Yes Jeanelle Malling, PA  doxycycline (VIBRA-TABS) 100 MG tablet Take 1 tablet (100 mg total) by mouth 2 (two) times daily. 10/02/22  Yes Jeanelle Malling, PA  EPINEPHrine 0.3 mg/0.3 mL IJ SOAJ injection Inject 0.3 mg into the muscle as needed for anaphylaxis. 12/01/19   Willy Eddy, MD  EPINEPHrine 0.3 mg/0.3 mL IJ SOAJ injection Inject 0.3 mg into the muscle as needed for anaphylaxis (only for difficulty breathing, tongue swelling, lip swelling, chest pain). 09/20/21   Rancour, Jeannett Senior, MD  famotidine (PEPCID) 20 MG tablet Take 1 tablet (20 mg total) by mouth 2 (two) times daily. 09/20/21   Rancour, Jeannett Senior, MD  meloxicam (MOBIC) 15 MG tablet Take 1 tablet (15 mg total) by mouth daily. 06/03/22 06/03/23  Sherrie Mustache Roselyn Bering, PA-C  omeprazole (PRILOSEC) 10 MG capsule Take 1 capsule (10 mg total) by mouth daily. Patient not taking: Reported on 12/01/2019 06/28/18 06/28/19  Enid Derry, PA-C  predniSONE (DELTASONE) 50 MG tablet 1  tablet PO daily 09/20/21   Rancour, Jeannett Senior, MD      Allergies    Bee venom, Fire ant, and Yellow jacket venom [bee venom]    Review of Systems   Review of Systems Negative except as per HPI.  Physical Exam Updated Vital Signs BP 115/82   Pulse 80   Temp 98.4 F (36.9 C) (Oral)   Resp 15   Ht 5\' 2"  (1.575 m)   Wt 74.8 kg   LMP 09/13/2022 (Approximate)   SpO2 100%   BMI 30.18 kg/m  Physical Exam Vitals and nursing note reviewed.  Constitutional:      Appearance: Normal appearance.  HENT:     Head: Normocephalic and atraumatic.     Mouth/Throat:     Mouth: Mucous membranes are moist.  Eyes:     General: No scleral icterus. Cardiovascular:     Rate and Rhythm: Normal rate and regular rhythm.     Pulses: Normal pulses.     Heart sounds: Normal heart sounds.  Pulmonary:     Effort: Pulmonary effort is normal.     Breath sounds: Normal breath sounds.  Abdominal:     General: Abdomen is flat.     Palpations: Abdomen is soft.     Tenderness: There is no abdominal tenderness.  Genitourinary:    Comments: Pelvic exam done with nurse tech present throughout the examination.  White thick malodorous discharge noted in the vagina.  Cervix appears normal.  No bleeding or  signs of trauma. Musculoskeletal:        General: No deformity.  Skin:    General: Skin is warm.     Findings: No rash.  Neurological:     General: No focal deficit present.     Mental Status: She is alert.  Psychiatric:        Mood and Affect: Mood normal.     ED Results / Procedures / Treatments   Labs (all labs ordered are listed, but only abnormal results are displayed) Labs Reviewed  WET PREP, GENITAL - Abnormal; Notable for the following components:      Result Value   Yeast Wet Prep HPF POC PRESENT (*)    All other components within normal limits  URINALYSIS, ROUTINE W REFLEX MICROSCOPIC  GC/CHLAMYDIA PROBE AMP ([place]) NOT AT [facility]    EKG None  Radiology No results  found.  Procedures Procedures    Medications Ordered in ED Medications  fluconazole (DIFLUCAN) tablet 150 mg (has no administration in time range)  cefTRIAXone (ROCEPHIN) injection 500 mg (has no administration in time range)  lidocaine (PF) (XYLOCAINE) 1 % injection 1-2.1 mL (has no administration in time range)    ED Course/ Medical Decision Making/ A&P                             Medical Decision Making Amount and/or Complexity of Data Reviewed Labs: ordered.  Risk OTC drugs. Prescription drug management.   This patient presents to the ED for vaginal pain and swelling, this involves an extensive number of treatment options, and is a complaint that carries with a high risk of complications and morbidity.  The differential diagnosis includes GC/chlamydia, trichomonas, BV, Candida vulvovaginitis, UTI infectious etiology.  This is not an exhaustive list.  Lab tests: I ordered and personally interpreted labs.  The pertinent results include: Wet prep positive for yeast infection.  Problem list/ ED course/ Critical interventions/ Medical management: HPI: See above Vital signs within normal range and stable throughout visit. Laboratory/imaging studies significant for: See above. On physical examination, patient is afebrile and appears in no acute distress.  Pelvic exam done with NT as chaperone present throughout examination.  There was thick white discharge noted in the vagina.  Cervix appeared normal.  No evidence of bleeding or acute trauma.  Wet prep was positive for yeast infection.  Trichomoniasis and BV was negative.  Will treat patient with Diflucan 150 mg.  GC/chlamydia ordered and pending.  Due to concern for GC chlamydia we will empirically treat patient with ceftriaxone here and send an Rx of doxycycline.  Advised patient to check MyChart for the results of her GC/chlamydia test, follow-up with her primary care physician for reevaluation.  Strict return precaution  discussed. I have reviewed the patient home medicines and have made adjustments as needed.  Cardiac monitoring/EKG: The patient was maintained on a cardiac monitor.  I personally reviewed and interpreted the cardiac monitor which showed an underlying rhythm of: sinus rhythm.  Additional history obtained: External records from outside source obtained and reviewed including: Chart review including previous notes, labs, imaging.  Consultations obtained:  Disposition Continued outpatient therapy. Follow-up with PCP recommended for reevaluation of symptoms. Treatment plan discussed with patient.  Pt acknowledged understanding was agreeable to the plan. Worrisome signs and symptoms were discussed with patient, and patient acknowledged understanding to return to the ED if they noticed these signs and symptoms. Patient was stable upon discharge.  This chart was dictated using voice recognition software.  Despite best efforts to proofread,  errors can occur which can change the documentation meaning.          Final Clinical Impression(s) / ED Diagnoses Final diagnoses:  Vaginal yeast infection    Rx / DC Orders ED Discharge Orders          Ordered    doxycycline (VIBRA-TABS) 100 MG tablet  2 times daily        10/02/22 1909    clotrimazole (GYNE-LOTRIMIN) 1 % vaginal cream  Daily at bedtime        10/02/22 1909              Jeanelle Malling, [place] 10/04/22 1358    Terald Sleeper, MD 10/06/22 [phone]

## 2022-10-02 NOTE — ED Notes (Signed)
NA for bed assignment 

## 2022-10-02 NOTE — ED Notes (Signed)
Pt refused vitals 

## 2022-10-02 NOTE — ED Notes (Signed)
Patient verbalizes understanding of discharge instructions. Opportunity for questioning and answers were provided. Armband removed by staff, pt discharged from ED. Pt ambulatory to ED waiting room with steady gait.  

## 2022-10-02 NOTE — ED Notes (Signed)
Pt found in lobby 

## 2022-10-02 NOTE — Discharge Instructions (Addendum)
Please check your MyChart for the chlamydia results.  If it is positive please start taking the doxycycline. Please take your other medications as prescribed. Take tylenol/ibuprofen for pain. I recommend close follow-up with PCP for reevaluation.  Please do not hesitate to return to emergency department if worrisome signs symptoms we discussed become apparent.

## 2022-10-02 NOTE — ED Notes (Signed)
Pt stepped outside.  

## 2022-11-21 ENCOUNTER — Other Ambulatory Visit: Payer: Medicaid Other

## 2023-05-14 ENCOUNTER — Telehealth: Admitting: Nurse Practitioner

## 2023-05-14 DIAGNOSIS — N760 Acute vaginitis: Secondary | ICD-10-CM

## 2023-05-14 DIAGNOSIS — B9689 Other specified bacterial agents as the cause of diseases classified elsewhere: Secondary | ICD-10-CM

## 2023-05-14 MED ORDER — METRONIDAZOLE 0.75 % VA GEL: VAGINAL | 0 refills | Status: DC

## 2023-05-14 NOTE — Progress Notes (Signed)
 Virtual Visit Consent   Ann Padilla, you are scheduled for a virtual visit with a [facility] provider today. Just as with appointments in the office, your consent must be obtained to participate. Your consent will be active for this visit and any virtual visit you may have with one of our providers in the next 365 days. If you have a MyChart account, a copy of this consent can be sent to you electronically.  As this is a virtual visit, video technology does not allow for your provider to perform a traditional examination. This may limit your provider's ability to fully assess your condition. If your provider identifies any concerns that need to be evaluated in person or the need to arrange testing (such as labs, EKG, etc.), we will make arrangements to do so. Although advances in technology are sophisticated, we cannot ensure that it will always work on either your end or our end. If the connection with a video visit is poor, the visit may have to be switched to a telephone visit. With either a video or telephone visit, we are not always able to ensure that we have a secure connection.  By engaging in this virtual visit, you consent to the provision of healthcare and authorize for your insurance to be billed (if applicable) for the services provided during this visit. Depending on your insurance coverage, you may receive a charge related to this service.  I need to obtain your verbal consent now. Are you willing to proceed with your visit today? Ann Padilla has provided verbal consent on 05/14/2023 for a virtual visit (video or telephone). Claiborne Rigg, NP  Date: 05/14/2023 6:44 PM   Virtual Visit via Video Note   I, Claiborne Rigg, connected with  Ann Padilla  ([id number], 1990-12-09) on 05/14/23 at  6:30 PM EST by a video-enabled telemedicine application and verified that I am speaking with the correct person using two identifiers.  Location: Patient: Virtual Visit Location Patient:  Home Provider: Virtual Visit Location Provider: Home Office   I discussed the limitations of evaluation and management by telemedicine and the availability of in person appointments. The patient expressed understanding and agreed to proceed.    History of Present Illness: Ann Padilla is a 33 y.o. who identifies as a female who was assigned female at birth, and is being seen today for bacterial vaginitis.  She is currently experiencing malodorous vaginal discharge which started a week ago. States she has a history of  reoccuring BV infections and current symptoms are similar.   Problems: There are no active problems to display for this patient.   Allergies:  Allergies  Allergen Reactions   Bee Venom Anaphylaxis   Fire Ant Anaphylaxis   Yellow Jacket Venom [Bee Venom]    Medications:  Current Outpatient Medications:    metroNIDAZOLE (METROGEL) 0.75 % vaginal gel, 1 applicatorful nightly for 5 days, Disp: 70 g, Rfl: 0   clotrimazole (GYNE-LOTRIMIN) 1 % vaginal cream, Place 1 Applicatorful vaginally at bedtime., Disp: 45 g, Rfl: 0   doxycycline (VIBRA-TABS) 100 MG tablet, Take 1 tablet (100 mg total) by mouth 2 (two) times daily., Disp: 14 tablet, Rfl: 0   EPINEPHrine 0.3 mg/0.3 mL IJ SOAJ injection, Inject 0.3 mg into the muscle as needed for anaphylaxis., Disp: 1 each, Rfl: 1   EPINEPHrine 0.3 mg/0.3 mL IJ SOAJ injection, Inject 0.3 mg into the muscle as needed for anaphylaxis (only for difficulty breathing, tongue swelling, lip swelling, chest pain)., Disp: 1 each,  Rfl: 0   famotidine (PEPCID) 20 MG tablet, Take 1 tablet (20 mg total) by mouth 2 (two) times daily., Disp: 30 tablet, Rfl: 0   meloxicam (MOBIC) 15 MG tablet, Take 1 tablet (15 mg total) by mouth daily., Disp: 30 tablet, Rfl: 2   omeprazole (PRILOSEC) 10 MG capsule, Take 1 capsule (10 mg total) by mouth daily. (Patient not taking: Reported on 12/01/2019), Disp: 30 capsule, Rfl: 0   predniSONE (DELTASONE) 50 MG tablet, 1  tablet PO daily, Disp: 5 tablet, Rfl: 0  Observations/Objective: Patient is well-developed, well-nourished in no acute distress.  Resting comfortably at home.  Head is normocephalic, atraumatic.  No labored breathing.  Speech is clear and coherent with logical content.  Patient is alert and oriented at baseline.    Assessment and Plan: 1. BV (bacterial vaginosis) (Primary) - metroNIDAZOLE (METROGEL) 0.75 % vaginal gel; 1 applicatorful nightly for 5 days  Dispense: 70 g; Refill: 0    Follow Up Instructions: I discussed the assessment and treatment plan with the patient. The patient was provided an opportunity to ask questions and all were answered. The patient agreed with the plan and demonstrated an understanding of the instructions.  A copy of instructions were sent to the patient via MyChart unless otherwise noted below.    The patient was advised to call back or seek an in-person evaluation if the symptoms worsen or if the condition fails to improve as anticipated.    Claiborne Rigg, NP

## 2023-05-14 NOTE — Patient Instructions (Signed)
  De Burrs, thank you for joining Claiborne Rigg, NP for today's virtual visit.  While this provider is not your primary care provider (PCP), if your PCP is located in our provider database this encounter information will be shared with them immediately following your visit.   A [facility] MyChart account gives you access to today's visit and all your visits, tests, and labs performed at [facility] " click here if you don't have a [facility] MyChart account or go to [email or web address]  Consent: (Patient) Ann Padilla provided verbal consent for this virtual visit at the beginning of the encounter.  Current Medications:  Current Outpatient Medications:    metroNIDAZOLE (METROGEL) 0.75 % vaginal gel, 1 applicatorful nightly for 5 days, Disp: 70 g, Rfl: 0   clotrimazole (GYNE-LOTRIMIN) 1 % vaginal cream, Place 1 Applicatorful vaginally at bedtime., Disp: 45 g, Rfl: 0   doxycycline (VIBRA-TABS) 100 MG tablet, Take 1 tablet (100 mg total) by mouth 2 (two) times daily., Disp: 14 tablet, Rfl: 0   EPINEPHrine 0.3 mg/0.3 mL IJ SOAJ injection, Inject 0.3 mg into the muscle as needed for anaphylaxis., Disp: 1 each, Rfl: 1   EPINEPHrine 0.3 mg/0.3 mL IJ SOAJ injection, Inject 0.3 mg into the muscle as needed for anaphylaxis (only for difficulty breathing, tongue swelling, lip swelling, chest pain)., Disp: 1 each, Rfl: 0   famotidine (PEPCID) 20 MG tablet, Take 1 tablet (20 mg total) by mouth 2 (two) times daily., Disp: 30 tablet, Rfl: 0   meloxicam (MOBIC) 15 MG tablet, Take 1 tablet (15 mg total) by mouth daily., Disp: 30 tablet, Rfl: 2   omeprazole (PRILOSEC) 10 MG capsule, Take 1 capsule (10 mg total) by mouth daily. (Patient not taking: Reported on 12/01/2019), Disp: 30 capsule, Rfl: 0   predniSONE (DELTASONE) 50 MG tablet, 1 tablet PO daily, Disp: 5 tablet, Rfl: 0   Medications ordered in this encounter:  Meds ordered this encounter  Medications   metroNIDAZOLE  (METROGEL) 0.75 % vaginal gel    Sig: 1 applicatorful nightly for 5 days    Dispense:  70 g    Refill:  0    Supervising Provider:   Merrilee Jansky [[id number]]     *If you need refills on other medications prior to your next appointment, please contact your pharmacy*  Follow-Up: Call back or seek an in-person evaluation if the symptoms worsen or if the condition fails to improve as anticipated.  [facility] [phone]    If you have been instructed to have an in-person evaluation today at a local Urgent Care facility, please use the link below. It will take you to a list of all of our available [facility], including address, phone number and hours of operation. Please do not delay care.  [facility]  If you or a family member do not have a primary care provider, use the link below to schedule a visit and establish care. When you choose a [facility] primary care physician or advanced practice provider, you gain a long-term partner in health. Find a Primary Care Provider  Learn more about [facility] in-office and virtual care options: [facility] Get Care Now

## 2023-05-28 ENCOUNTER — Telehealth

## 2023-06-05 ENCOUNTER — Other Ambulatory Visit: Payer: Self-pay

## 2023-06-05 ENCOUNTER — Emergency Department (HOSPITAL_COMMUNITY)
Admission: EM | Admit: 2023-06-05 | Discharge: 2023-06-05 | Disposition: A | Discharge status: Home / Self Care | Attending: Emergency Medicine | Admitting: Emergency Medicine

## 2023-06-05 ENCOUNTER — Telehealth: Admitting: Physician Assistant

## 2023-06-05 DIAGNOSIS — Z87892 Personal history of anaphylaxis: Secondary | ICD-10-CM | POA: Diagnosis not present

## 2023-06-05 DIAGNOSIS — R102 Pelvic and perineal pain: Secondary | ICD-10-CM | POA: Diagnosis present

## 2023-06-05 DIAGNOSIS — B3731 Acute candidiasis of vulva and vagina: Secondary | ICD-10-CM | POA: Diagnosis not present

## 2023-06-05 DIAGNOSIS — R109 Unspecified abdominal pain: Secondary | ICD-10-CM

## 2023-06-05 LAB — URINALYSIS, ROUTINE W REFLEX MICROSCOPIC
Bilirubin Urine: NEGATIVE
Glucose, UA: NEGATIVE mg/dL
Hgb urine dipstick: NEGATIVE
Ketones, ur: NEGATIVE mg/dL
Nitrite: NEGATIVE
Protein, ur: NEGATIVE mg/dL
Specific Gravity, Urine: 1.029 (ref 1.005–1.030)
pH: 5 (ref 5.0–8.0)

## 2023-06-05 LAB — GC/CHLAMYDIA PROBE AMP (~~LOC~~) NOT AT ARMC
Chlamydia: NEGATIVE
Comment: NEGATIVE
Comment: NORMAL
Neisseria Gonorrhea: NEGATIVE

## 2023-06-05 LAB — WET PREP, GENITAL
Clue Cells Wet Prep HPF POC: NONE SEEN
Sperm: NONE SEEN
Trich, Wet Prep: NONE SEEN
WBC, Wet Prep HPF POC: <10 (ref <10)

## 2023-06-05 LAB — PREGNANCY, URINE: Preg Test, Ur: NEGATIVE

## 2023-06-05 MED ORDER — EPINEPHRINE 0.3 MG/0.3ML IJ SOAJ: 0.3000 mg | INTRAMUSCULAR | 0 refills | Status: AC | PRN

## 2023-06-05 MED ORDER — IBUPROFEN 400 MG PO TABS
400.0000 mg | ORAL_TABLET | Freq: Once | ORAL | Status: AC | PRN
  Administered 2023-06-05: 400 mg via ORAL
  Filled 2023-06-05: qty 1

## 2023-06-05 MED ORDER — CLOTRIMAZOLE 1 % VA CREA: 1.0000 | TOPICAL_CREAM | Freq: Every day | VAGINAL | 0 refills | Status: DC

## 2023-06-05 NOTE — ED Provider Notes (Signed)
 [facility] EMERGENCY DEPARTMENT AT [facility] Provider Note   CSN: [id number] Arrival date & time: 06/05/23  0131     History  Chief Complaint  Patient presents with   Pelvic Pain    Ann Padilla is a 33 y.o. female.  Patient with past medical history significant for bacterial vaginosis and recurrent yeast infections presents emergency room complaining of dysuria and white vaginal discharge.  Patient also complains of left-sided flank pain which she states has been ongoing for multiple months.  Triage note mentions pelvic pain but patient denies the same during my assessment.  White vaginal discharge symptoms began approximately 1 week ago.  She states that she has had creams for yeast infections in the past which have not been effective.  She also endorses using metronidazole for recurrent bacterial vaginosis.  She denies fever, nausea, vomiting, other systemic symptoms.   Pelvic Pain       Home Medications Prior to Admission medications   Medication Sig Start Date End Date Taking? Authorizing Provider  clotrimazole (GYNE-LOTRIMIN) 1 % vaginal cream Place 1 Applicatorful vaginally at bedtime. 06/05/23   Darrick Grinder, PA-C  doxycycline (VIBRA-TABS) 100 MG tablet Take 1 tablet (100 mg total) by mouth 2 (two) times daily. 10/02/22   Jeanelle Malling, PA  EPINEPHrine 0.3 mg/0.3 mL IJ SOAJ injection Inject 0.3 mg into the muscle as needed for anaphylaxis. 12/01/19   Willy Eddy, MD  EPINEPHrine 0.3 mg/0.3 mL IJ SOAJ injection Inject 0.3 mg into the muscle as needed for anaphylaxis (only for difficulty breathing, tongue swelling, lip swelling, chest pain). 09/20/21   Rancour, Jeannett Senior, MD  famotidine (PEPCID) 20 MG tablet Take 1 tablet (20 mg total) by mouth 2 (two) times daily. 09/20/21   Rancour, Jeannett Senior, MD  metroNIDAZOLE (METROGEL) 0.75 % vaginal gel 1 applicatorful nightly for 5 days 05/14/23   Claiborne Rigg, NP  omeprazole (PRILOSEC) 10 MG capsule Take 1 capsule (10 mg  total) by mouth daily. Patient not taking: Reported on 12/01/2019 06/28/18 06/28/19  Enid Derry, PA-C  predniSONE (DELTASONE) 50 MG tablet 1 tablet PO daily 09/20/21   Glynn Octave, MD      Allergies    Bee venom, Fire ant, and Yellow jacket venom [bee venom]    Review of Systems   Review of Systems  Genitourinary:  Positive for pelvic pain.    Physical Exam Updated Vital Signs BP (!) 117/55   Pulse 80   Temp 98.4 F (36.9 C)   Resp 16   Ht 5\' 2"  (1.575 m)   Wt 74.8 kg   LMP 05/09/2023   SpO2 100%   Breastfeeding No   BMI 30.18 kg/m  Physical Exam Vitals and nursing note reviewed.  Constitutional:      General: She is not in acute distress.    Appearance: She is well-developed.  HENT:     Head: Normocephalic and atraumatic.  Eyes:     Conjunctiva/sclera: Conjunctivae normal.  Cardiovascular:     Rate and Rhythm: Normal rate and regular rhythm.  Pulmonary:     Effort: Pulmonary effort is normal. No respiratory distress.     Breath sounds: Normal breath sounds.  Abdominal:     Palpations: Abdomen is soft.     Tenderness: There is no abdominal tenderness.  Genitourinary:    Comments: Deferred Musculoskeletal:        General: No swelling.     Cervical back: Neck supple.  Skin:    General: Skin is warm and  dry.     Capillary Refill: Capillary refill takes less than 2 seconds.  Neurological:     Mental Status: She is alert.  Psychiatric:        Mood and Affect: Mood normal.     ED Results / Procedures / Treatments   Labs (all labs ordered are listed, but only abnormal results are displayed) Labs Reviewed  WET PREP, GENITAL - Abnormal; Notable for the following components:      Result Value   Yeast Wet Prep HPF POC PRESENT (*)    All other components within normal limits  URINALYSIS, ROUTINE W REFLEX MICROSCOPIC - Abnormal; Notable for the following components:   APPearance CLOUDY (*)    Leukocytes,Ua LARGE (*)    Bacteria, UA FEW (*)    All other  components within normal limits  PREGNANCY, URINE  GC/CHLAMYDIA PROBE AMP ([place]) NOT AT [facility]    EKG None  Radiology No results found.  Procedures Procedures    Medications Ordered in ED Medications  ibuprofen (ADVIL) tablet 400 mg (400 mg Oral Given 06/05/23 0144)    ED Course/ Medical Decision Making/ A&P                                 Medical Decision Making Amount and/or Complexity of Data Reviewed Labs: ordered.  Risk Prescription drug management.   This patient presents to the ED for concern of vaginal discharge and back pain, this involves an extensive number of treatment options, and is a complaint that carries with it a high risk of complications and morbidity.  The differential diagnosis includes STI, candida, others. Back pain differential includes MSK pain, nephrolithiasis, hydronephrosis, others   Co morbidities that complicate the patient evaluation  Recurrent BV   Additional history obtained:   External records from outside source obtained and reviewed including patient's MyChart showing previous medications   Lab Tests:  I Ordered, and personally interpreted labs.  The pertinent results include: Wet prep positive for yeast   Imaging Studies ordered:  Patient has no abdominal tenderness on exam.  No indication at this time for abdominal imaging   Problem List / ED Course / Critical interventions / Medication management   I ordered medication including ibuprofen for flank pain Reevaluation of the patient after these medicines showed that the patient improved I have reviewed the patients home medicines and have made adjustments as needed   Social Determinants of Health:  Patient has Medicaid for primary health insurance type   Test / Admission - Considered:  Patient with yeast on wet prep. Plan to prescribe cream for treatment.  Gonorrhea chlamydia probe pending at this time.  Patient does not describe symptoms consistent with  GC chlamydia infection, will not treat at this time until labs result.  Patient will follow MyChart for results and understands that if positive she will need antibiotics. Patient's left-sided flank pain seems to be musculoskeletal in nature.  It is reproducible with movement.  No CVA tenderness.  No abdominal tenderness.  Urine with no hemoglobin.  Will recommend ibuprofen/acetaminophen for treatment.         Final Clinical Impression(s) / ED Diagnoses Final diagnoses:  Flank pain  Yeast infection involving the vagina and surrounding area    Rx / DC Orders ED Discharge Orders          Ordered    clotrimazole (GYNE-LOTRIMIN) 1 % vaginal cream  Daily at bedtime  06/05/23 0547              Darrick Grinder, PA-C 06/05/23 0558    Nira Conn, MD 06/05/23 [phone]

## 2023-06-05 NOTE — Progress Notes (Signed)
 Virtual Visit Consent   Ann Padilla, you are scheduled for a virtual visit with a [facility] provider today. Just as with appointments in the office, your consent must be obtained to participate. Your consent will be active for this visit and any virtual visit you may have with one of our providers in the next 365 days. If you have a MyChart account, a copy of this consent can be sent to you electronically.  As this is a virtual visit, video technology does not allow for your provider to perform a traditional examination. This may limit your provider's ability to fully assess your condition. If your provider identifies any concerns that need to be evaluated in person or the need to arrange testing (such as labs, EKG, etc.), we will make arrangements to do so. Although advances in technology are sophisticated, we cannot ensure that it will always work on either your end or our end. If the connection with a video visit is poor, the visit may have to be switched to a telephone visit. With either a video or telephone visit, we are not always able to ensure that we have a secure connection.  By engaging in this virtual visit, you consent to the provision of healthcare and authorize for your insurance to be billed (if applicable) for the services provided during this visit. Depending on your insurance coverage, you may receive a charge related to this service.  I need to obtain your verbal consent now. Are you willing to proceed with your visit today? Ann Padilla has provided verbal consent on 06/05/2023 for a virtual visit (video or telephone). Piedad Climes, [place]  Date: 06/05/2023 11:15 AM   Virtual Visit via Video Note   I, Piedad Climes, connected with  Ann Padilla  ([id number], 07-28-1990) on 06/05/23 at 11:15 AM EDT by a video-enabled telemedicine application and verified that I am speaking with the correct person using two identifiers.  Location: Patient: Virtual Visit Location  Patient: Home Provider: Virtual Visit Location Provider: Home Office   I discussed the limitations of evaluation and management by telemedicine and the availability of in person appointments. The patient expressed understanding and agreed to proceed.    History of Present Illness: Ann Padilla is a 33 y.o. who identifies as a female who was assigned female at birth, and is being seen today for request of refill. 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 Marland Kitchen  HPI: HPI  Problems: There are no active problems to display for this patient.   Allergies:  Allergies  Allergen Reactions   Bee Venom Anaphylaxis   Fire Ant Anaphylaxis   Yellow Jacket Venom [Bee Venom]    Medications:  Current Outpatient Medications:    clotrimazole (GYNE-LOTRIMIN) 1 % vaginal cream, Place 1 Applicatorful vaginally at bedtime., Disp: 45 g, Rfl: 0   doxycycline (VIBRA-TABS) 100 MG tablet, Take 1 tablet (100 mg total) by mouth 2 (two) times daily., Disp: 14 tablet, Rfl: 0   EPINEPHrine 0.3 mg/0.3 mL IJ SOAJ injection, Inject 0.3 mg into the muscle as needed for anaphylaxis (only for difficulty breathing, tongue swelling, lip swelling, chest pain)., Disp: 1 each, Rfl: 0   famotidine (PEPCID) 20 MG tablet, Take 1 tablet (20 mg total) by mouth 2 (two) times daily., Disp: 30 tablet, Rfl: 0   metroNIDAZOLE (METROGEL) 0.75 % vaginal gel,  1 applicatorful nightly for 5 days, Disp: 70 g, Rfl: 0  Observations/Objective: Patient is well-developed, well-nourished in no acute distress.  Resting comfortably  at home.  Head is normocephalic, atraumatic.  No labored breathing.  Speech is clear and coherent with logical content.  Patient is alert and oriented at baseline.   Assessment and Plan: 1. Hx of anaphylaxis (Primary) - EPINEPHrine 0.3 mg/0.3 mL IJ SOAJ injection; Inject 0.3 mg into  the muscle as needed for anaphylaxis (only for difficulty breathing, tongue swelling, lip swelling, chest pain).  Dispense: 1 each; Refill: 0  Epi-pen refilled due to current device being expired.   Follow Up Instructions: I discussed the assessment and treatment plan with the patient. The patient was provided an opportunity to ask questions and all were answered. The patient agreed with the plan and demonstrated an understanding of the instructions.  A copy of instructions were sent to the patient via MyChart unless otherwise noted below.   The patient was advised to call back or seek an in-person evaluation if the symptoms worsen or if the condition fails to improve as anticipated.    Piedad Climes, PA-C

## 2023-06-05 NOTE — Patient Instructions (Signed)
 De Burrs, thank you for joining Piedad Climes, PA-C for today's virtual visit.  While this provider is not your primary care provider (PCP), if your PCP is located in our provider database this encounter information will be shared with them immediately following your visit.   A [facility] MyChart account gives you access to today's visit and all your visits, tests, and labs performed at [facility] " click here if you don't have a [facility] MyChart account or go to [email or web address]  Consent: (Patient) Ann Padilla provided verbal consent for this virtual visit at the beginning of the encounter.  Current Medications:  Current Outpatient Medications:    clotrimazole (GYNE-LOTRIMIN) 1 % vaginal cream, Place 1 Applicatorful vaginally at bedtime., Disp: 45 g, Rfl: 0   doxycycline (VIBRA-TABS) 100 MG tablet, Take 1 tablet (100 mg total) by mouth 2 (two) times daily., Disp: 14 tablet, Rfl: 0   EPINEPHrine 0.3 mg/0.3 mL IJ SOAJ injection, Inject 0.3 mg into the muscle as needed for anaphylaxis (only for difficulty breathing, tongue swelling, lip swelling, chest pain)., Disp: 1 each, Rfl: 0   famotidine (PEPCID) 20 MG tablet, Take 1 tablet (20 mg total) by mouth 2 (two) times daily., Disp: 30 tablet, Rfl: 0   metroNIDAZOLE (METROGEL) 0.75 % vaginal gel, 1 applicatorful nightly for 5 days, Disp: 70 g, Rfl: 0   Medications ordered in this encounter:  Meds ordered this encounter  Medications   EPINEPHrine 0.3 mg/0.3 mL IJ SOAJ injection    Sig: Inject 0.3 mg into the muscle as needed for anaphylaxis (only for difficulty breathing, tongue swelling, lip swelling, chest pain).    Dispense:  1 each    Refill:  0    Supervising Provider:   Merrilee Jansky [id number]     *If you need refills on other medications prior to your next appointment, please contact your pharmacy*  Follow-Up: Call back or seek an in-person evaluation if the symptoms worsen or if the  condition fails to improve as anticipated.  [facility] [phone]  Other Instructions How to Use an Auto-Injector Pen An auto-injector pen is a device filled with medicine that gives a shot of epinephrine. It relaxes the muscles in the airways and tightens the blood vessels. It is used to treat a severe allergic reaction (anaphylactic reaction). Using an auto-injector pen can save your life. You should always carry one with you if you are at risk for an anaphylactic reaction. Other names for an auto-injector pen include an epinephrine injection, an epinephrine pen, and an automatic injection device. What are the risks? It is safe to use an auto-injector pen. Watch for problems, such as damage to bone or tissue. Make sure that you place the needle in the right spot when you give the shot. It should be in the muscle of your outer thigh as told by your health care provider. When should I use my auto-injector pen? Use your auto-injector pen as soon as you think you are having an anaphylactic reaction. Signs may include: Feeling warm in the face (flushed). Your face may turn red. Itchy, red, swollen areas of skin (hives). Swelling of the eyes, lips, face, mouth, tongue, or throat. Trouble breathing, speaking, or swallowing. Noisy breathing (wheezing). Feeling dizzy or light-headed. Fainting. Pain or cramping in the abdomen. Vomiting or diarrhea. These symptoms may be an emergency. Get help right away. Call 911. Use the auto-injector pen as told by your provider. Do not wait to see if the  symptoms will go away. Do not drive yourself to the hospital. How to use the auto-injector pen  Sit or lie down before you take or are given the shot. Use the auto-injector pen to give the shot under your skin or into your muscle on the outer side of your thigh. Do not inject it into your butt or any other part of your body. If you need to, you can use the pen through your clothing. After  you give yourself the shot, there may still be some liquid in the pen. This is normal. Replace the liquid medicine. If possible, carry two auto-injector pens. If you need a second dose, put the shot somewhere else on your outer thigh. Do not put two shots into the same spot. This can lead to tissue damage. General tips Use the auto-injector pen as told by your provider. Do not inject it more often than your provider tells you to. Do not add in extra medicine or take less than you should. Most pens contain one dose of epinephrine. Some have two doses. From time to time: Check the expiration date on your auto-injector pen. Check the solution in the pen. Make sure it is not cloudy and there are no particles floating in it. If the pen has expired or the solution does not look like it should, throw the pen away and get a new one. Ask your provider how to get rid of used or expired auto-injector pens. Talk with your provider or a pharmacist if you are not sure about how to give yourself the shot. Get a prescription for another auto-injector pen. Fill the prescription as soon as you can. Get help right away if: You use the auto-injector pen. You must still get medical help, even if the medicine seems to be working. You have side effects from the epinephrine. These may include: Fast or irregular heartbeat. Nervousness or anxiety with shaking that does not stop. Trouble breathing. Sweating. Headache. Nausea or vomiting. Dizziness or weakness. These symptoms may be an emergency. Get help right away. Call 911. Do not wait to see if the symptoms will go away. Do not drive yourself to the hospital. This information is not intended to replace advice given to you by your health care provider. Make sure you discuss any questions you have with your health care provider. Document Revised: 10/17/2021 Document Reviewed: 10/17/2021 Elsevier Patient Education  2024 Elsevier Inc.   If you have been instructed  to have an in-person evaluation today at a local Urgent Care facility, please use the link below. It will take you to a list of all of our available [facility], including address, phone number and hours of operation. Please do not delay care.  [facility]  If you or a family member do not have a primary care provider, use the link below to schedule a visit and establish care. When you choose a [facility] primary care physician or advanced practice provider, you gain a long-term partner in health. Find a Primary Care Provider  Learn more about [facility]'s in-office and virtual care options:  - Get Care Now

## 2023-06-05 NOTE — Discharge Instructions (Signed)
 Please take the prescribed medications for your yeast infection and follow up with your primary team. Your flank pain seems musculoskeletal in nature. You may use acetaminophen and ibuprofen for pain management.

## 2023-06-05 NOTE — ED Triage Notes (Signed)
 Pt reports concern for burning with urination and thick white vaginal discharge associated with pelvic pain that radiates into the back. S/s started 1 week ago, progressing in severity now.

## 2023-06-16 ENCOUNTER — Encounter

## 2023-06-17 ENCOUNTER — Ambulatory Visit

## 2023-06-17 VITALS — BP 137/77 | Ht 62.0 in | Wt 166.5 lb

## 2023-06-17 DIAGNOSIS — Z309 Encounter for contraceptive management, unspecified: Secondary | ICD-10-CM | POA: Diagnosis not present

## 2023-06-17 DIAGNOSIS — Z3201 Encounter for pregnancy test, result positive: Secondary | ICD-10-CM | POA: Diagnosis not present

## 2023-06-17 LAB — PREGNANCY, URINE: Preg Test, Ur: POSITIVE — AB

## 2023-06-17 MED ORDER — PRENATAL 27-0.8 MG PO TABS: 1.0000 | ORAL_TABLET | Freq: Every day | ORAL | Status: DC

## 2023-06-17 NOTE — Progress Notes (Signed)
 UPT positive. Positive preg packet given and reviewed. Plans prenatal care at [facility].   Patient has hx preterm labor, fetal death of twins at [redacted] weeks gestation, lived for one to two weeks after delivery. Subsequent preg with preterm deliveries. Hx C section x4. Patient would be considered high risk and advised to establish prenatal care ASAP. Patient in agreement.   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 Last epi pen use was 2 yrs ago. Patient asks whether epi pen ok to use while preg.  Consult Aliene Altes, FNP who explains epi pen ok to use in pregnancy. RN explained this to patient.   The patient was dispensed prenatal vitamins #100 today per SO Dr Lorrin Mais. I provided counseling today regarding the medication. We discussed the medication, the side effects and when to call clinic. Patient given the opportunity to ask questions. Questions answered.    Patient has medicaid and advised to contact her medicaid case worker to change status to pregnancy medicaid. Jerel Shepherd, RN

## 2023-06-19 ENCOUNTER — Other Ambulatory Visit: Payer: Self-pay

## 2023-06-19 ENCOUNTER — Inpatient Hospital Stay (HOSPITAL_COMMUNITY)
Admission: EM | Admit: 2023-06-19 | Discharge: 2023-06-20 | Disposition: A | Discharge status: Home / Self Care | Attending: Obstetrics and Gynecology | Admitting: Obstetrics and Gynecology

## 2023-06-19 ENCOUNTER — Encounter (HOSPITAL_COMMUNITY): Payer: Self-pay | Admitting: Emergency Medicine

## 2023-06-19 ENCOUNTER — Inpatient Hospital Stay (HOSPITAL_COMMUNITY)

## 2023-06-19 DIAGNOSIS — O4690 Antepartum hemorrhage, unspecified, unspecified trimester: Secondary | ICD-10-CM

## 2023-06-19 DIAGNOSIS — O200 Threatened abortion: Secondary | ICD-10-CM | POA: Diagnosis present

## 2023-06-19 LAB — WET PREP, GENITAL
Sperm: NONE SEEN
Trich, Wet Prep: NONE SEEN
WBC, Wet Prep HPF POC: >=10 — AB (ref <10)
Yeast Wet Prep HPF POC: NONE SEEN

## 2023-06-19 NOTE — ED Triage Notes (Signed)
 Patient presents due to spotting during pregnancy (6 weeks). The on call nurse told her to come here. She has also noticed cramping.

## 2023-06-19 NOTE — Discharge Instructions (Addendum)
 Please drive to [facility] for evaluation. I spoke with an NP, Dorathy Daft who should be expecting you.

## 2023-06-19 NOTE — ED Provider Notes (Signed)
 [place] EMERGENCY DEPARTMENT AT [facility] Provider Note   CSN: [id number] Arrival date & time: 06/19/23  2042     History  Chief Complaint  Patient presents with   Vaginal Bleeding    Ann Padilla is a 33 y.o. female with past medical history significant for hypertension during pregnancy, with 4 previous C-sections, 4 living children who presents concern for some mild pelvic cramping and spotting during early pregnancy.  She was just seen 2 days ago with positive pregnancy test.  Dating by last menstrual cycle would put her at just about 6 weeks.  She denies any fever, chills, dysuria, vaginal discharge.   Vaginal Bleeding      Home Medications Prior to Admission medications   Medication Sig Start Date End Date Taking? Authorizing Provider  clotrimazole  (GYNE-LOTRIMIN ) 1 % vaginal cream Place 1 Applicatorful vaginally at bedtime. Patient not taking: Reported on 06/17/2023 06/05/23   Elisa Guest, PA-C  doxycycline  (VIBRA -TABS) 100 MG tablet Take 1 tablet (100 mg total) by mouth 2 (two) times daily. Patient not taking: Reported on 06/17/2023 10/02/22   Thomes Flicker, PA  EPINEPHrine  0.3 mg/0.3 mL IJ SOAJ injection Inject 0.3 mg into the muscle as needed for anaphylaxis (only for difficulty breathing, tongue swelling, lip swelling, chest pain). 06/05/23   Farris Hong, PA-C  famotidine  (PEPCID ) 20 MG tablet Take 1 tablet (20 mg total) by mouth 2 (two) times daily. Patient not taking: Reported on 06/17/2023 09/20/21   Earma Gloss, MD  metroNIDAZOLE  (METROGEL ) 0.75 % vaginal gel 1 applicatorful nightly for 5 days Patient not taking: Reported on 06/17/2023 05/14/23   Collins Dean, NP  Prenatal Vit-Fe Fumarate-FA (MULTIVITAMIN-PRENATAL) 27-0.8 MG TABS tablet Take 1 tablet by mouth daily at 12 noon. 06/17/23 09/25/23  Santana Cue, MD      Allergies    Bee venom, Fire ant, and Yellow jacket venom [bee venom]    Review of Systems   Review of Systems   Genitourinary:  Positive for vaginal bleeding.  All other systems reviewed and are negative.   Physical Exam Updated Vital Signs BP 120/80 (BP Location: Right Arm)   Pulse 92   Temp 97.9 F (36.6 C) (Oral)   Resp 14   LMP 05/09/2023 (Exact Date)   SpO2 100%  Physical Exam Vitals and nursing note reviewed.  Constitutional:      General: She is not in acute distress.    Appearance: Normal appearance.  HENT:     Head: Normocephalic and atraumatic.  Eyes:     General:        Right eye: No discharge.        Left eye: No discharge.  Cardiovascular:     Rate and Rhythm: Normal rate and regular rhythm.  Pulmonary:     Effort: Pulmonary effort is normal. No respiratory distress.  Abdominal:     Comments: Mild tenderness to palpation of the suprapubic region, no rebound, rigidity, guarding.  Genitourinary:    Comments: Overall normal appearance of cervix, with very light pink-red spotting, no active hemorrhage, no products of conception, cervix is closed. Musculoskeletal:        General: No deformity.  Skin:    General: Skin is warm and dry.  Neurological:     Mental Status: She is alert and oriented to person, place, and time.  Psychiatric:        Mood and Affect: Mood normal.        Behavior: Behavior normal.  ED Results / Procedures / Treatments   Labs (all labs ordered are listed, but only abnormal results are displayed) Labs Reviewed  WET PREP, GENITAL - Abnormal; Notable for the following components:      Result Value   Clue Cells Wet Prep HPF POC PRESENT (*)    WBC, Wet Prep HPF POC >=10 (*)    All other components within normal limits  ABO/RH  GC/CHLAMYDIA PROBE AMP ([place]) NOT AT [facility]    EKG None  Radiology No results found.  Procedures Procedures    Medications Ordered in ED Medications - No data to display  ED Course/ Medical Decision Making/ A&P Clinical Course as of 06/19/23 2231  Fri Jun 19, 2023  2217 Yeast Wet Prep HPF POC:  NONE SEEN [CP]    Clinical Course User Index [CP] Nelly Banco, PA-C                                 Medical Decision Making Amount and/or Complexity of Data Reviewed Labs: ordered. Radiology: ordered.   This patient is a 33 y.o. female  who presents to the ED for concern of vaginal bleeding in early pregnancy.   Differential diagnoses prior to evaluation: The emergent differential diagnosis includes, but is not limited to, spontaneous, inevitable, threatened miscarriage, subchorionic hemorrhage, UTI, pyelonephritis, PID, versus other. This is not an exhaustive differential.   Past Medical History / Co-morbidities / Social History: Four previous pregnancies, cerclage due to cervical insufficiency during previous pregnancy  Physical Exam: Physical exam performed. The pertinent findings include:  Mild tenderness to palpation of the suprapubic region, no rebound, rigidity, guarding.  Overall normal appearance of cervix, with very light pink-red spotting, no active hemorrhage, no products of conception, cervix is closed.  Lab Tests/Imaging studies: I reviewed patient's records, she is O+, will not need RhoGAM, her wet prep does show some clue cells but she is not symptomatic, will defer treatment, she has not provided a urine for us  yet, however we do not have ultrasound here this evening, given concern for miscarriage I do think an ultrasound would be helpful to assess this early pregnancy that has not had any prenatal care yet, because of this we will plan to send patient to the MAU, I spoke with Atha Lazar, NP who accepted patient in POV transfer.   Disposition: After consideration of the diagnostic results and the patients response to treatment, I feel that patient is stable for POV transfer to MAU for probably threatened miscarriage .   emergency department workup does not suggest an emergent condition requiring admission or immediate intervention beyond what has been  performed at this time. The plan is: as above. The patient is safe for discharge and has been instructed to return immediately for worsening symptoms, change in symptoms or any other concerns.  Final Clinical Impression(s) / ED Diagnoses Final diagnoses:  Threatened miscarriage  Vaginal bleeding in pregnancy    Rx / DC Orders ED Discharge Orders     None         Stefan Edge 06/19/23 2231    Sueellen Emery, MD 06/29/23 [phone]

## 2023-06-20 ENCOUNTER — Encounter (HOSPITAL_COMMUNITY): Payer: Self-pay | Admitting: *Deleted

## 2023-06-20 LAB — ABO/RH: ABO/RH(D): O POS

## 2023-06-20 LAB — HCG, QUANTITATIVE, PREGNANCY: hCG, Beta Chain, Quant, S: 376 m[IU]/mL — ABNORMAL HIGH (ref <5)

## 2023-06-20 NOTE — MAU Note (Signed)
 Pt says cramping started today - saw light pink blood when she wiped  Went to [facility] ER- for cramping- checked cervix- closed. Sent here for U/S  Cramping now- 9/10

## 2023-06-20 NOTE — MAU Provider Note (Signed)
 Event Date/Time   First Provider Initiated Contact with Patient 06/20/23 0010      [name] is a 33 y.o. [id number] patient who presents to MAU today, from [facility], for ultrasound. Patient states she was seen for cramping and spotting. She reports she had labs and cervical exam completed with no significant findings. Patient reports she receives ob/gyn care in [place].   O BP 116/67 (BP Location: Right Arm)   Pulse 86   Temp 98.5 F (36.9 C) (Oral)   Resp 12   Ht 5' 2.5" (1.588 m)   Wt 76.4 kg   LMP 05/09/2023 (Exact Date)   SpO2 100%   BMI 30.31 kg/m  Physical Exam Constitutional:      Appearance: Normal appearance.  HENT:     Head: Normocephalic and atraumatic.  Eyes:     Conjunctiva/sclera: Conjunctivae normal.  Cardiovascular:     Rate and Rhythm: Normal rate.  Pulmonary:     Effort: Pulmonary effort is normal. No respiratory distress.  Neurological:     Mental Status: She is alert and oriented to person, place, and time.  Psychiatric:        Mood and Affect: Mood normal.        Behavior: Behavior normal.    Results for orders placed or performed during the hospital encounter of 06/19/23 (from the past 24 hours)  Wet prep, genital     Status: Abnormal   Collection Time: 06/19/23  9:30 PM   Specimen: Vaginal  Result Value Ref Range   Yeast Wet Prep HPF POC NONE SEEN NONE SEEN   Trich, Wet Prep NONE SEEN NONE SEEN   Clue Cells Wet Prep HPF POC PRESENT (A) NONE SEEN   WBC, Wet Prep HPF POC >=10 (A) <10   Sperm NONE SEEN   hCG, quantitative, pregnancy     Status: Abnormal   Collection Time: 06/19/23 11:39 PM  Result Value Ref Range   hCG, Beta Chain, Quant, S 376 (H) <5 mIU/mL  ABO/Rh     Status: None   Collection Time: 06/19/23 11:41 PM  Result Value Ref Range   ABO/RH(D) O POS    No rh immune globuloin      NOT A RH IMMUNE GLOBULIN CANDIDATE, PT RH POSITIVE Performed at [facility], [address], [place], [place] [address]    [place] Comp  Less 14 Wks Result Date: 06/20/2023 CLINICAL DATA:  Vaginal bleeding, pregnant, assigned gestational age [redacted] weeks, 6 days. EXAM: OBSTETRIC <14 WK [place] AND TRANSVAGINAL OB US TECHNIQUE: Both transabdominal and transvaginal ultrasound examinations were performed for complete evaluation of the gestation as well as the maternal uterus, adnexal regions, and pelvic cul-de-sac. Transvaginal technique was performed to assess early pregnancy. COMPARISON:  None Available. FINDINGS: Intrauterine gestational sac: None identified Maternal uterus/adnexae: The uterus is anteverted. The cervix is unremarkable. Defect is seen within the lower anterior uterine segment in keeping with prior Caesarean section. The myometrial echotexture is coarsened and the uterine contour is lobulated. Multiple heterogeneously hypoechoic intrauterine masses are seen, the largest within the right uterine fundus intramurally measuring 4.2 x 4.3 x 4.5 cm in keeping with multiple uterine fibroids. A submucosal fibroid is seen within the mid body of the uterus measuring 2.7 x 2.5 x 2.8 cm. The endometrium is deviated by the submucosal fibroid, but is not thickened measuring 17 mm. The maternal ovaries are unremarkable. Trace simple appearing free fluid within the cul-de-sac. No adnexal masses. IMPRESSION: Pregnancy location  not visualized sonographically. Differential diagnosis includes recent spontaneous abortion, IUP too early to visualize, and non-visualized ectopic pregnancy. Recommend close follow up of quantitative B-HCG levels, and follow up US  as clinically warranted. Fibroid uterus Electronically Signed   By: Worthy Heads M.D.   On: 06/20/2023 00:10   US  OB Transvaginal Result Date: 06/20/2023 CLINICAL DATA:  Vaginal bleeding, pregnant, assigned gestational age [redacted] weeks, 6 days. EXAM: OBSTETRIC <14 WK US  AND TRANSVAGINAL OB US  TECHNIQUE: Both transabdominal and transvaginal ultrasound examinations were performed for complete evaluation of the  gestation as well as the maternal uterus, adnexal regions, and pelvic cul-de-sac. Transvaginal technique was performed to assess early pregnancy. COMPARISON:  None Available. FINDINGS: Intrauterine gestational sac: None identified Maternal uterus/adnexae: The uterus is anteverted. The cervix is unremarkable. Defect is seen within the lower anterior uterine segment in keeping with prior Caesarean section. The myometrial echotexture is coarsened and the uterine contour is lobulated. Multiple heterogeneously hypoechoic intrauterine masses are seen, the largest within the right uterine fundus intramurally measuring 4.2 x 4.3 x 4.5 cm in keeping with multiple uterine fibroids. A submucosal fibroid is seen within the mid body of the uterus measuring 2.7 x 2.5 x 2.8 cm. The endometrium is deviated by the submucosal fibroid, but is not thickened measuring 17 mm. The maternal ovaries are unremarkable. Trace simple appearing free fluid within the cul-de-sac. No adnexal masses. IMPRESSION: Pregnancy location not visualized sonographically. Differential diagnosis includes recent spontaneous abortion, IUP too early to visualize, and non-visualized ectopic pregnancy. Recommend close follow up of quantitative B-HCG levels, and follow up US  as clinically warranted. Fibroid uterus Electronically Signed   By: Worthy Heads M.D.   On: 06/20/2023 00:10     A Medical screening exam complete Vaginal Bleeding Ultrasound  P -Results as above. -Informed that US  showed no IUGS or pregnancy. -Discussed need to wait for hCG levels, but will require repeat labs in 48 hours.  -Patient informed that results will be sent to her via mychart.  -Agreeable with plan. -Precautions reviewed. -Discharged to home in stable condition.  Ann Padilla, CNM 06/20/2023 12:20 AM    Addendum 5:31 AM -hCG levels as above. -Mychart message sent to patient instructing her to follow up with primary office or MAU in 48 hours.   Ann Peru MSN, CNM Advanced Practice Provider, [facility]

## 2023-06-22 ENCOUNTER — Other Ambulatory Visit: Payer: Self-pay

## 2023-06-22 ENCOUNTER — Emergency Department

## 2023-06-22 ENCOUNTER — Telehealth: Payer: Self-pay | Admitting: *Deleted

## 2023-06-22 ENCOUNTER — Emergency Department
Admission: EM | Admit: 2023-06-22 | Discharge: 2023-06-22 | Disposition: A | Discharge status: Home / Self Care | Attending: Emergency Medicine | Admitting: Emergency Medicine

## 2023-06-22 DIAGNOSIS — D649 Anemia, unspecified: Secondary | ICD-10-CM | POA: Diagnosis not present

## 2023-06-22 DIAGNOSIS — O209 Hemorrhage in early pregnancy, unspecified: Secondary | ICD-10-CM | POA: Insufficient documentation

## 2023-06-22 DIAGNOSIS — O99011 Anemia complicating pregnancy, first trimester: Secondary | ICD-10-CM | POA: Insufficient documentation

## 2023-06-22 DIAGNOSIS — O4690 Antepartum hemorrhage, unspecified, unspecified trimester: Secondary | ICD-10-CM

## 2023-06-22 DIAGNOSIS — Z3A01 Less than 8 weeks gestation of pregnancy: Secondary | ICD-10-CM | POA: Diagnosis not present

## 2023-06-22 DIAGNOSIS — O3680X0 Pregnancy with inconclusive fetal viability, not applicable or unspecified: Secondary | ICD-10-CM

## 2023-06-22 DIAGNOSIS — O3680X1 Pregnancy with inconclusive fetal viability, fetus 1: Secondary | ICD-10-CM | POA: Diagnosis not present

## 2023-06-22 LAB — HCG, QUANTITATIVE, PREGNANCY: hCG, Beta Chain, Quant, S: 193 m[IU]/mL — ABNORMAL HIGH (ref <5)

## 2023-06-22 LAB — BASIC METABOLIC PANEL WITH GFR
Anion gap: 8 (ref 5–15)
BUN: 11 mg/dL (ref 6–20)
CO2: 24 mmol/L (ref 22–32)
Calcium: 9.2 mg/dL (ref 8.9–10.3)
Chloride: 105 mmol/L (ref 98–111)
Creatinine, Ser: 0.94 mg/dL (ref 0.44–1.00)
GFR, Estimated: >60 mL/min (ref >60)
Glucose, Bld: 107 mg/dL — ABNORMAL HIGH (ref 70–99)
Potassium: 3 mmol/L — ABNORMAL LOW (ref 3.5–5.1)
Sodium: 137 mmol/L (ref 135–145)

## 2023-06-22 LAB — CBC WITH DIFFERENTIAL/PLATELET
Abs Immature Granulocytes: 0.04 10*3/uL (ref 0.00–0.07)
Basophils Absolute: 0.1 10*3/uL (ref 0.0–0.1)
Basophils Relative: 1 %
Eosinophils Absolute: 0.1 10*3/uL (ref 0.0–0.5)
Eosinophils Relative: 1 %
HCT: 28.2 % — ABNORMAL LOW (ref 36.0–46.0)
Hemoglobin: 8.2 g/dL — ABNORMAL LOW (ref 12.0–15.0)
Immature Granulocytes: 0 %
Lymphocytes Relative: 22 %
Lymphs Abs: 2.7 10*3/uL (ref 0.7–4.0)
MCH: 21.2 pg — ABNORMAL LOW (ref 26.0–34.0)
MCHC: 29.1 g/dL — ABNORMAL LOW (ref 30.0–36.0)
MCV: 72.9 fL — ABNORMAL LOW (ref 80.0–100.0)
Monocytes Absolute: 1 10*3/uL (ref 0.1–1.0)
Monocytes Relative: 8 %
Neutro Abs: 8.3 10*3/uL — ABNORMAL HIGH (ref 1.7–7.7)
Neutrophils Relative %: 68 %
Platelets: 476 10*3/uL — ABNORMAL HIGH (ref 150–400)
RBC: 3.87 MIL/uL (ref 3.87–5.11)
RDW: 18.6 % — ABNORMAL HIGH (ref 11.5–15.5)
WBC: 12.2 10*3/uL — ABNORMAL HIGH (ref 4.0–10.5)
nRBC: 0 % (ref 0.0–0.2)

## 2023-06-22 LAB — GC/CHLAMYDIA PROBE AMP (~~LOC~~) NOT AT ARMC
Chlamydia: NEGATIVE
Comment: NEGATIVE
Comment: NORMAL
Neisseria Gonorrhea: NEGATIVE

## 2023-06-22 MED ORDER — FERROUS SULFATE 325 (65 FE) MG PO TBEC: 325.0000 mg | DELAYED_RELEASE_TABLET | Freq: Two times a day (BID) | ORAL | 3 refills | Status: DC

## 2023-06-22 MED ORDER — POTASSIUM CHLORIDE CRYS ER 20 MEQ PO TBCR
40.0000 meq | EXTENDED_RELEASE_TABLET | Freq: Once | ORAL | Status: AC
  Administered 2023-06-22: 40 meq via ORAL
  Filled 2023-06-22: qty 2

## 2023-06-22 NOTE — Telephone Encounter (Signed)
 I called Ann Padilla and explained I am calling because her doctor recommends she get hormone level drawn asap because she missed going back to the hospital yesterday. I offered stat bhcg appointment tomorrow. She states she can't come tomorrow because she is working. I explained we recommend she get a level drawn asap because she could have an ectopic pregnancy which can be dangerous. She denies pain and states spotting when she wipes when she goes to bathroom. I advised she can go back to the hospital anytime because it is open 24/7 or she can come to us  tomorrow. She states she will probably go to [facility] because it is closer. I asked her to call  us  back if needed. She voices understanding. Alejandra Hurst

## 2023-06-22 NOTE — Discharge Instructions (Addendum)
 Your ultrasound today did not show the location of your pregnancy.  You will need to follow-up in 2 days with Dr. Madelene Schanz or another provider at [facility] clinic OB/GYN.  In the meantime, you should return to the emergency room for any worsening pain or bleeding.  You should also take iron supplementation regularly and have your blood counts rechecked by your OB/GYN.

## 2023-06-22 NOTE — ED Provider Notes (Signed)
 [facility] Provider Note    Event Date/Time   First MD Initiated Contact with Patient 06/22/23 1926     (approximate)   History   Chief Complaint Abdominal Pain   HPI  Ann Padilla is a 33 y.o. female [id number] at approximately 6 weeks of pregnancy who presents to the ED complaining of vaginal bleeding.  Patient reports that she initially had a prodromal state of pregnancy test about 5 days ago, developed some light vaginal bleeding a couple of days later with crampy pain in her abdomen.  Bleeding was initially described as spotting and seem to resolve the same day that it started.  She was seen in the [facility], ED at that time, pregnancy was not visualized on ultrasound.  She was told to follow-up for repeat beta-hCG and ultrasound in 2 days and so presents to the ED today.  She states that she noticed a small amount of blood when wiping after urinating today, otherwise has not noticed any bleeding and denies abdominal pain.     Physical Exam   Triage Vital Signs: ED Triage Vitals  Encounter Vitals Group     BP 06/22/23 1731 135/85     Systolic BP Percentile --      Diastolic BP Percentile --      Pulse Rate 06/22/23 1731 99     Resp 06/22/23 1731 16     Temp 06/22/23 1731 98.8 F (37.1 C)     Temp Source 06/22/23 1731 Oral     SpO2 06/22/23 1731 99 %     Weight 06/22/23 1733 168 lb (76.2 kg)     Height 06/22/23 1733 5\' 3"  (1.6 m)     Head Circumference --      Peak Flow --      Pain Score 06/22/23 1732 8     Pain Loc --      Pain Education --      Exclude from Growth Chart --     Most recent vital signs: Vitals:   06/22/23 1731  BP: 135/85  Pulse: 99  Resp: 16  Temp: 98.8 F (37.1 C)  SpO2: 99%    Constitutional: Alert and oriented. Eyes: Conjunctivae are normal. Head: Atraumatic. Nose: No congestion/rhinnorhea. Mouth/Throat: Mucous membranes are moist.  Cardiovascular: Normal rate, regular rhythm. Grossly normal heart  sounds.  2+ radial pulses bilaterally. Respiratory: Normal respiratory effort.  No retractions. Lungs CTAB. Gastrointestinal: Soft and nontender. No distention. Musculoskeletal: No lower extremity tenderness nor edema.  Neurologic:  Normal speech and language. No gross focal neurologic deficits are appreciated.    ED Results / Procedures / Treatments   Labs (all labs ordered are listed, but only abnormal results are displayed) Labs Reviewed  HCG, QUANTITATIVE, PREGNANCY - Abnormal; Notable for the following components:      Result Value   hCG, Beta Chain, Quant, S 193 (*)    All other components within normal limits  CBC WITH DIFFERENTIAL/PLATELET - Abnormal; Notable for the following components:   WBC 12.2 (*)    Hemoglobin 8.2 (*)    HCT 28.2 (*)    MCV 72.9 (*)    MCH 21.2 (*)    MCHC 29.1 (*)    RDW 18.6 (*)    Platelets 476 (*)    Neutro Abs 8.3 (*)    All other components within normal limits  BASIC METABOLIC PANEL WITH GFR - Abnormal; Notable for the following components:   Potassium 3.0 (*)  Glucose, Bld 107 (*)    All other components within normal limits    RADIOLOGY OB ultrasound reviewed and interpreted by me with no intrauterine pregnancy visualized.  PROCEDURES:  Critical Care performed: No  Procedures   MEDICATIONS ORDERED IN ED: Medications  potassium chloride SA (KLOR-CON M) CR tablet 40 mEq (40 mEq Oral Given 06/22/23 2123)     IMPRESSION / MDM / ASSESSMENT AND PLAN / ED COURSE  I reviewed the triage vital signs and the nursing notes.                              33 y.o. female [phone] at approximately 6 weeks of pregnancy who presents to the ED complaining of intermittent vaginal bleeding over the past 2 days.  Patient's presentation is most consistent with acute presentation with potential threat to life or bodily function.  Differential diagnosis includes, but is not limited to, ectopic pregnancy, completed miscarriage, threatened  miscarriage, anemia.  Patient nontoxic-appearing and in no acute distress, vital signs are unremarkable.  She has a benign abdominal exam, denies any pain or bleeding at this time.  Beta-hCG levels are downtrending by greater than 20% from 2 days ago, will repeat ultrasound but suspect this is part of a miscarriage.  Low suspicion for ectopic pregnancy given her resolving symptoms, labs do show significant microcytic anemia with last hemoglobin from greater than 1 year ago.  Patient does report history of anemia and states that she is supposed to be on iron, but is not currently taking it.  Suspect that anemia today is chronic given patient describes minimal bleeding.  She is Rh+ and there is no indication for RhoGAM, no significant leukocytosis or AKI noted, will replete potassium.  Obstetric ultrasound again unable to visualize intrauterine pregnancy, does note right ovarian cystic structure favored to be corpus luteum cyst.  Findings reviewed with Dr. Madelene Schanz of OB/GYN, who agrees that this is likely a miscarriage given downtrending beta-hCG.  He agrees with plan for outpatient management and follow-up in 2 days in the clinic for repeat hormone levels and potential ultrasound.  We will also start patient on iron supplementation for anemia, she was counseled to return to the ED for new or worsening symptoms.  Patient agrees with plan.      FINAL CLINICAL IMPRESSION(S) / ED DIAGNOSES   Final diagnoses:  Vaginal bleeding in pregnancy  Pregnancy of unknown anatomic location  Anemia, unspecified type     Rx / DC Orders   ED Discharge Orders          Ordered    ferrous sulfate 325 (65 FE) MG EC tablet  2 times daily        06/22/23 2256             Note:  This document was prepared using Dragon voice recognition software and may include unintentional dictation errors.   Twilla Galea, MD 06/22/23 2259

## 2023-06-22 NOTE — ED Provider Triage Note (Signed)
 Emergency Medicine Provider Triage Evaluation Note  Ann Padilla , a 33 y.o. female  was evaluated in triage.  Pt complains of vaginal bleeding while pregnant. She states she only sees blood when wiping. Was evaluated for this a few days ago. On 4/11 went to the ED and they did not have US  available. Was seen by maternal fetal medicine and had a US , showed pregnancy of unknown location. Beta HCG on 4/11 was 375.  Review of Systems  Positive: Vaginal bleeding and lower abdominal pain Negative:   Physical Exam  LMP 05/09/2023 (Exact Date)  Gen:   Awake, no distress   Resp:  Normal effort  MSK:   Moves extremities without difficulty  Other:    Medical Decision Making  Medically screening exam initiated at 5:31 PM.  Appropriate orders placed.  Lakea Padilla was informed that the remainder of the evaluation will be completed by another provider, this initial triage assessment does not replace that evaluation, and the importance of remaining in the ED until their evaluation is complete.     Ann Breen, PA-C 06/22/23 1734

## 2023-06-22 NOTE — ED Triage Notes (Signed)
 Pt presents to ED from home C/O abdominal cramping X 1 week, L flank pain X 4 days. Pt endorses vaginal bleeding described as blood on tissue when she wipes X 4 days.   G5P4

## 2023-07-01 ENCOUNTER — Other Ambulatory Visit: Payer: Self-pay | Admitting: Obstetrics and Gynecology

## 2023-07-01 ENCOUNTER — Ambulatory Visit
Admission: RE | Admit: 2023-07-01 | Discharge: 2023-07-01 | Disposition: A | Discharge status: Home / Self Care | Source: Ambulatory Visit | Attending: Obstetrics and Gynecology | Admitting: Obstetrics and Gynecology

## 2023-07-01 DIAGNOSIS — O0091 Unspecified ectopic pregnancy with intrauterine pregnancy: Secondary | ICD-10-CM | POA: Diagnosis present

## 2023-07-01 DIAGNOSIS — Z3A00 Weeks of gestation of pregnancy not specified: Secondary | ICD-10-CM | POA: Insufficient documentation

## 2023-07-01 MED ORDER — METHOTREXATE FOR ECTOPIC PREGNANCY
50.0000 mg/m2 | Freq: Once | INTRAMUSCULAR | Status: AC
  Administered 2023-07-01: 92.5 mg via INTRAMUSCULAR
  Filled 2023-07-01: qty 3.7

## 2023-07-01 NOTE — Progress Notes (Signed)
 Methotrexate  injection IM for tx of ectopic pregnancy . Follow up labs at [facility]

## 2023-07-01 NOTE — Discharge Instructions (Signed)
Following Methotrexate Administration for Ectopic Pregnancy  Your physician will obtain follow-up blood work to monitor the effect of the medication.   After receiving methotrexate avoid:  Alcoholic beverages  Vitamins containing folic acid Foods that contain folic acid, including fortified cereal, enriched bread and pasta, peanuts, dark green leafy vegetables, orange juice, and beans  Gas-forming foods  Nonsteroidal antiinflammatory painkillers  Sexual intercourse or any strenuous activity because it may cause the fallopian tube to rupture Do not become pregnant for 3 months to decrease the risk of birth defects.  You may experience side effects, like nausea, vomiting, dizziness, and mouth and lip ulcers.  Most women have abdominal pain a couple of days after the injection.  Notify your obstetric practitioner or return to the emergency department if you develop severe abdominal pain, dizziness or fainting, heavy vaginal bleeding, severe nausea and vomiting, or fever.  Double-flush the toilet with the lid closed for 72 hours after receiving the injection.   

## 2023-07-01 NOTE — Patient Instructions (Signed)
 Following Methotrexate  Administration for Ectopic Pregnancy  Your physician will obtain follow-up blood work to monitor the effect of the medication.   After receiving methotrexate  avoid:  Alcoholic beverages  Vitamins containing folic acid Foods that contain folic acid, including fortified cereal, enriched bread and pasta, peanuts, dark green leafy vegetables, orange juice, and beans  Gas-forming foods  Nonsteroidal antiinflammatory painkillers  Sexual intercourse or any strenuous activity because it may cause the fallopian tube to rupture Do not become pregnant for 3 months to decrease the risk of birth defects.  You may experience side effects, like nausea, vomiting, dizziness, and mouth and lip ulcers.  Most women have abdominal pain a couple of days after the injection.  Notify your obstetric practitioner or return to the emergency department if you develop severe abdominal pain, dizziness or fainting, heavy vaginal bleeding, severe nausea and vomiting, or fever.  Double-flush the toilet with the lid closed for 72 hours after receiving the injection.                        Following Methotrexate  Administration for Ectopic Pregnancy  Your physician will obtain follow-up blood work to monitor the effect of the medication.   After receiving methotrexate  avoid:  Alcoholic beverages  Vitamins containing folic acid Foods that contain folic acid, including fortified cereal, enriched bread and pasta, peanuts, dark green leafy vegetables, orange juice, and beans  Gas-forming foods  Nonsteroidal antiinflammatory painkillers  Sexual intercourse or any strenuous activity because it may cause the fallopian tube to rupture Do not become pregnant for 3 months to decrease the risk of birth defects.  You may experience side effects, like nausea, vomiting, dizziness, and mouth and lip ulcers.  Most women have abdominal pain a couple of days after the injection.  Notify your  obstetric practitioner or return to the emergency department if you develop severe abdominal pain, dizziness or fainting, heavy vaginal bleeding, severe nausea and vomiting, or fever.  Double-flush the toilet with the lid closed for 72 hours after receiving the injection. Methotrexate  Treatment for an Ectopic Pregnancy  Methotrexate  is a medicine that treats an ectopic pregnancy. In this type of pregnancy, the fertilized egg attaches (implants) outside the uterus. An ectopic pregnancy cannot develop into a healthy baby. Methotrexate  works by stopping the growth of the fertilized egg. It also helps the body absorb tissue from the egg. This takes about 2-6 weeks. An ectopic pregnancy can be life-threatening. However, most ectopic pregnancies can be successfully treated with methotrexate  if they are diagnosed early. Tell a health care provider about: Any allergies you have. All medicines you are taking, including vitamins, herbs, eye drops, creams, and over-the-counter medicines. Any medical conditions you have. What are the risks? Your health care provider will talk with you about risks. These may include: Digestive problems. You may have: Nausea. Vomiting. Diarrhea. Cramping in your abdomen. Bleeding or spotting from your vagina. Feeling dizzy or light-headed. Mouth sores. Inflammation of the lining of your lungs (pneumonitis). Damage to nearby structures or organs, such as damage to the liver. Hair loss. There is a risk that methotrexate  treatment will fail and the pregnancy will continue. There is also a risk that the ectopic pregnancy might tear or burst (rupture) during use of this medicine. What happens before the procedure? Blood tests will be done to check how your disease-fighting system (immune system), liver, and kidneys are working. You will also have blood tests to measure your pregnancy hormone levels and to  find out your blood type. You will be given a shot of a medicine  called Rho(D) immune globulin if: You are Rh-negative and the father is Rh-positive. You are Rh-negative and the father's Rh type is unknown. What happens during the procedure?  Methotrexate  will be injected into your muscle. Methotrexate  may be given as a single dose of medicine or a series of doses over time, depending on your response to the treatment. Methotrexate  injections are given by a health care provider. Injection is the most common way that this medicine is used to treat an ectopic pregnancy. You may also receive other medicines to manage your ectopic pregnancy. The procedure may vary among health care providers and hospitals. What happens after the procedure? After your treatment, it is common to have: No immediate side effects. Blood tests will be taken at timed intervals for several days or weeks to check your pregnancy hormone levels. The blood tests will continue until the pregnancy hormone can no longer be found in the blood. Summary Methotrexate  is a medicine that treats an ectopic pregnancy. This type of pregnancy forms outside the uterus. There is a risk that methotrexate  treatment will fail and the pregnancy will continue. There is also a risk that the ectopic pregnancy might tear or burst while using this medicine. This medicine may be given in a single dose or a series of doses over time. After your treatment, blood tests will be done at timed intervals for several days or weeks to check your pregnancy hormone levels. The blood tests will continue until the pregnancy hormone can no longer be found in the blood. This information is not intended to replace advice given to you by your health care provider. Make sure you discuss any questions you have with your health care provider. Document Revised: 01/09/2022 Document Reviewed: 01/09/2022 Elsevier Patient Education  2024 [employer].

## 2023-07-02 ENCOUNTER — Other Ambulatory Visit: Payer: Self-pay

## 2023-07-02 ENCOUNTER — Emergency Department
Admission: EM | Admit: 2023-07-02 | Discharge: 2023-07-03 | Disposition: A | Discharge status: Home / Self Care | Attending: Emergency Medicine | Admitting: Emergency Medicine

## 2023-07-02 ENCOUNTER — Encounter: Payer: Self-pay | Admitting: *Deleted

## 2023-07-02 DIAGNOSIS — Z9229 Personal history of other drug therapy: Secondary | ICD-10-CM | POA: Diagnosis not present

## 2023-07-02 DIAGNOSIS — Z3200 Encounter for pregnancy test, result unknown: Secondary | ICD-10-CM

## 2023-07-02 DIAGNOSIS — R112 Nausea with vomiting, unspecified: Secondary | ICD-10-CM | POA: Diagnosis not present

## 2023-07-02 DIAGNOSIS — N939 Abnormal uterine and vaginal bleeding, unspecified: Secondary | ICD-10-CM | POA: Diagnosis not present

## 2023-07-02 DIAGNOSIS — R109 Unspecified abdominal pain: Secondary | ICD-10-CM

## 2023-07-02 DIAGNOSIS — R1030 Lower abdominal pain, unspecified: Secondary | ICD-10-CM | POA: Insufficient documentation

## 2023-07-02 DIAGNOSIS — Z9221 Personal history of antineoplastic chemotherapy: Secondary | ICD-10-CM

## 2023-07-02 LAB — COMPREHENSIVE METABOLIC PANEL WITH GFR
ALT: 10 U/L (ref 0–44)
AST: 17 U/L (ref 15–41)
Albumin: 3.9 g/dL (ref 3.5–5.0)
Alkaline Phosphatase: 54 U/L (ref 38–126)
Anion gap: 8 (ref 5–15)
BUN: 7 mg/dL (ref 6–20)
CO2: 21 mmol/L — ABNORMAL LOW (ref 22–32)
Calcium: 8.6 mg/dL — ABNORMAL LOW (ref 8.9–10.3)
Chloride: 105 mmol/L (ref 98–111)
Creatinine, Ser: 0.82 mg/dL (ref 0.44–1.00)
GFR, Estimated: >60 mL/min (ref >60)
Glucose, Bld: 101 mg/dL — ABNORMAL HIGH (ref 70–99)
Potassium: 3.2 mmol/L — ABNORMAL LOW (ref 3.5–5.1)
Sodium: 134 mmol/L — ABNORMAL LOW (ref 135–145)
Total Bilirubin: 0.5 mg/dL (ref 0.0–1.2)
Total Protein: 7.7 g/dL (ref 6.5–8.1)

## 2023-07-02 LAB — CBC
HCT: 26.8 % — ABNORMAL LOW (ref 36.0–46.0)
Hemoglobin: 7.7 g/dL — ABNORMAL LOW (ref 12.0–15.0)
MCH: 20.5 pg — ABNORMAL LOW (ref 26.0–34.0)
MCHC: 28.7 g/dL — ABNORMAL LOW (ref 30.0–36.0)
MCV: 71.3 fL — ABNORMAL LOW (ref 80.0–100.0)
Platelets: 359 10*3/uL (ref 150–400)
RBC: 3.76 MIL/uL — ABNORMAL LOW (ref 3.87–5.11)
RDW: 18 % — ABNORMAL HIGH (ref 11.5–15.5)
WBC: 9.3 10*3/uL (ref 4.0–10.5)
nRBC: 0 % (ref 0.0–0.2)

## 2023-07-02 LAB — HCG, QUANTITATIVE, PREGNANCY: hCG, Beta Chain, Quant, S: 833 m[IU]/mL — ABNORMAL HIGH (ref <5)

## 2023-07-02 MED ORDER — ONDANSETRON HCL 4 MG/2ML IJ SOLN
4.0000 mg | Freq: Once | INTRAMUSCULAR | Status: AC
  Administered 2023-07-03: 4 mg via INTRAVENOUS
  Filled 2023-07-02: qty 2

## 2023-07-02 MED ORDER — SODIUM CHLORIDE 0.9 % IV BOLUS
1000.0000 mL | Freq: Once | INTRAVENOUS | Status: AC
  Administered 2023-07-03: 1000 mL via INTRAVENOUS

## 2023-07-02 NOTE — ED Notes (Signed)
 Pt reports being given methotrexate  shot yesterday by OB and is having N/V/D and vaginal bleeding today. Pt states OB told her if she had any of these symptoms to come to the ER.

## 2023-07-02 NOTE — ED Provider Notes (Signed)
 [facility] Provider Note    Event Date/Time   First MD Initiated Contact with Patient 07/02/23 2303     (approximate)   History   Nausea   HPI  Ann Padilla is a 33 y.o. female   Past medical history of recent treatment for ectopic pregnancy with IM methotrexate  at gynecology office yesterday presents with lower abdominal cramping pain and scant vaginal bleeding since then.  She has also had nausea and vomiting.  Bowel movements of the normal.  External Medical Documents Reviewed: Gynecology note from yesterday documenting IM methotrexate  for ectopic pregnancy treatment      Physical Exam   Triage Vital Signs: ED Triage Vitals [07/02/23 2052]  Encounter Vitals Group     BP 120/83     Systolic BP Percentile      Diastolic BP Percentile      Pulse Rate 92     Resp 20     Temp 98.8 F (37.1 C)     Temp Source Oral     SpO2 95 %     Weight 166 lb (75.3 kg)     Height 5\' 2"  (1.575 m)     Head Circumference      Peak Flow      Pain Score 10     Pain Loc      Pain Education      Exclude from Growth Chart     Most recent vital signs: Vitals:   07/02/23 2052 07/02/23 2230  BP: 120/83 115/70  Pulse: 92 77  Resp: 20 16  Temp: 98.8 F (37.1 C) 98.9 F (37.2 C)  SpO2: 95% 100%    General: Awake, no distress.  CV:  Good peripheral perfusion.  Resp:  Normal effort.  Abd:  No distention.  Other:  Defer pelvic exam.  Mild discomfort to palpation of the lower central abdomen, no rigidity or guarding.  Other quadrants soft nontender.  Specifically negative Murphy sign.  Hemodynamics reviewed appropriate and reassuring.   ED Results / Procedures / Treatments   Labs (all labs ordered are listed, but only abnormal results are displayed) Labs Reviewed  COMPREHENSIVE METABOLIC PANEL WITH GFR - Abnormal; Notable for the following components:      Result Value   Sodium 134 (*)    Potassium 3.2 (*)    CO2 21 (*)    Glucose, Bld 101 (*)     Calcium 8.6 (*)    All other components within normal limits  CBC - Abnormal; Notable for the following components:   RBC 3.76 (*)    Hemoglobin 7.7 (*)    HCT 26.8 (*)    MCV 71.3 (*)    MCH 20.5 (*)    MCHC 28.7 (*)    RDW 18.0 (*)    All other components within normal limits  HCG, QUANTITATIVE, PREGNANCY - Abnormal; Notable for the following components:   hCG, Beta Chain, Quant, S 833 (*)    All other components within normal limits     I ordered and reviewed the above labs they are notable for hemoglobin of 7.7 with a baseline in the eights to nines   PROCEDURES:  Critical Care performed: No  Procedures   MEDICATIONS ORDERED IN ED: Medications  sodium chloride  0.9 % bolus 1,000 mL (has no administration in time range)  ondansetron  (ZOFRAN ) injection 4 mg (has no administration in time range)    External physician / consultants:  I spoke with gynecology [facility] clinic on-call  consultant Arnetta Bianchi regarding care plan for this patient.   IMPRESSION / MDM / ASSESSMENT AND PLAN / ED COURSE  I reviewed the triage vital signs and the nursing notes.                                Patient's presentation is most consistent with acute presentation with potential threat to life or bodily function.  Differential diagnosis includes, but is not limited to, ectopic pregnancy, ruptured ectopic pregnancy, intra-abdominal infection, side effect of methotrexate  medication   MDM:    She is a patient who developed lower abdominal cramping pain and nausea after methotrexate  received in office earlier today.  She has known ectopic pregnancy she is being treated for.  Vital signs are reassuring and she has had a reported scant amount of vaginal bleeding, with an H&H which is not markedly changed from her prior anemic levels-with this in mind I think that these symptoms are most likely due to the known effect of her IM methotrexate  rather than ruptured ectopic pregnancy or other  life-threatening intra-abdominal or pelvic emergencies at this time.  She is mostly concerned about her nausea.  I do not think this is biliary pathologies given the benign right upper quadrant exam and normal LFTs.  I will give her IV fluids and IV Zofran .  I spoke with on-call gynecology team Collin Deal via epic messaging regarding the patient's symptoms, lab testing, onset of symptoms and timing of medications given by gynecology in clinic yesterday and they agree with clinical plan as above.  She is to call gynecology clinic tomorrow morning to schedule very close follow-up.  I gave her strict return precautions for any new or worsening symptoms to return to the emergency department immediately.       FINAL CLINICAL IMPRESSION(S) / ED DIAGNOSES   Final diagnoses:  Abdominal cramping  Nausea and vomiting, unspecified vomiting type  History of methotrexate  therapy  Encounter for assessment for suspected ectopic pregnancy     Rx / DC Orders   ED Discharge Orders     None        Note:  This document was prepared using Dragon voice recognition software and may include unintentional dictation errors.    Buell Carmin, MD 07/03/23 Georgana Kilts

## 2023-07-02 NOTE — ED Triage Notes (Addendum)
 Pt ambulatory to triage.  Pt states she got an ectopic pregnancy shot yesterday.  Today pt has nausea.  Pt has vaginal bleeding x 2 days.  Pt saw ob md yesterday.  Pt alert.

## 2023-07-02 NOTE — ED Provider Notes (Incomplete)
 [facility] Provider Note    Event Date/Time   First MD Initiated Contact with Patient 07/02/23 2303     (approximate)   History   Nausea   HPI  Ann Padilla is a 33 y.o. female   Past medical history of ***    Independent Historian contributed to assessment above: ***  External Medical Documents Reviewed: ***      Physical Exam   Triage Vital Signs: ED Triage Vitals [07/02/23 2052]  Encounter Vitals Group     BP 120/83     Systolic BP Percentile      Diastolic BP Percentile      Pulse Rate 92     Resp 20     Temp 98.8 F (37.1 C)     Temp Source Oral     SpO2 95 %     Weight 166 lb (75.3 kg)     Height 5\' 2"  (1.575 m)     Head Circumference      Peak Flow      Pain Score 10     Pain Loc      Pain Education      Exclude from Growth Chart     Most recent vital signs: Vitals:   07/02/23 2052 07/02/23 2230  BP: 120/83 115/70  Pulse: 92 77  Resp: 20 16  Temp: 98.8 F (37.1 C) 98.9 F (37.2 C)  SpO2: 95% 100%    General: Awake, no distress. *** CV:  Good peripheral perfusion. *** Resp:  Normal effort. *** Abd:  No distention. *** Other:  ***   ED Results / Procedures / Treatments   Labs (all labs ordered are listed, but only abnormal results are displayed) Labs Reviewed  COMPREHENSIVE METABOLIC PANEL WITH GFR - Abnormal; Notable for the following components:      Result Value   Sodium 134 (*)    Potassium 3.2 (*)    CO2 21 (*)    Glucose, Bld 101 (*)    Calcium 8.6 (*)    All other components within normal limits  CBC - Abnormal; Notable for the following components:   RBC 3.76 (*)    Hemoglobin 7.7 (*)    HCT 26.8 (*)    MCV 71.3 (*)    MCH 20.5 (*)    MCHC 28.7 (*)    RDW 18.0 (*)    All other components within normal limits  HCG, QUANTITATIVE, PREGNANCY - Abnormal; Notable for the following components:   hCG, Beta Chain, Quant, S 833 (*)    All other components within normal limits     I ordered  and reviewed the above labs they are notable for ***  EKG  ED ECG REPORT I, Buell Carmin, the attending physician, personally viewed and interpreted this ECG.   Date: 07/02/2023  EKG Time: ***  Rate: ***  Rhythm: {ekg findings:315101}  Axis: ***  Intervals:{conduction defects:17367}  ST&T Change: ***    RADIOLOGY I independently reviewed and interpreted *** I also reviewed radiologist's formal read.   PROCEDURES:  Critical Care performed: {CriticalCareYesNo:19197::"Yes, see critical care procedure note(s)","No"}  Procedures   MEDICATIONS ORDERED IN ED: Medications  sodium chloride  0.9 % bolus 1,000 mL (has no administration in time range)  ondansetron  (ZOFRAN ) injection 4 mg (has no administration in time range)    External physician / consultants:  I spoke with *** regarding care plan for this patient.   IMPRESSION / MDM / ASSESSMENT AND PLAN / ED COURSE  I reviewed the triage vital signs and the nursing notes.                                Patient's presentation is most consistent with {EM COPA:27473}  Differential diagnosis includes, but is not limited to, ***   ***The patient is on the cardiac monitor to evaluate for evidence of arrhythmia and/or significant heart rate changes.  MDM:  ***  I considered hospitalization for admission or observation ***        FINAL CLINICAL IMPRESSION(S) / ED DIAGNOSES   Final diagnoses:  None     Rx / DC Orders   ED Discharge Orders     None        Note:  This document was prepared using Dragon voice recognition software and may include unintentional dictation errors.

## 2023-07-03 NOTE — Discharge Instructions (Signed)
 Take Zofran  that you had been previously prescribed to help with nausea and vomiting. Drink plenty of fluids to stay well-hydrated.  Find Pedialyte or similar electrolyte rehydration formulas at your local pharmacy.   At this time I think that your symptoms are most likely due to the effects of methotrexate . Please call your gynecologist in the morning to schedule a follow-up appointment as soon as possible to keep a close eye on your symptoms and review your response to the methotrexate  treatment for your ectopic pregnancy.  Should you experience any new or worsening symptoms call your doctor right away or return to the emergency department for reevaluation.  Thank you for choosing us  for your health care today!  Please see your primary doctor this week for a follow up appointment.   If you have any new, worsening, or unexpected symptoms call your doctor right away or come back to the emergency department for reevaluation.  It was my pleasure to care for you today.   Arron Large Margery Sheets, MD

## 2023-09-02 ENCOUNTER — Encounter: Payer: Self-pay | Admitting: Physician Assistant

## 2023-09-02 ENCOUNTER — Telehealth: Admitting: Physician Assistant

## 2023-09-02 DIAGNOSIS — N760 Acute vaginitis: Secondary | ICD-10-CM

## 2023-09-02 DIAGNOSIS — B9689 Other specified bacterial agents as the cause of diseases classified elsewhere: Secondary | ICD-10-CM | POA: Diagnosis not present

## 2023-09-02 MED ORDER — METRONIDAZOLE 0.75 % VA GEL: VAGINAL | 0 refills | Status: AC

## 2023-09-02 NOTE — Patient Instructions (Signed)
 [name], thank you for joining [name], PA-C for today's virtual visit.  While this provider is not your primary care provider (PCP), if your PCP is located in our provider database this encounter information will be shared with them immediately following your visit.   A [facility] MyChart account gives you access to today's visit and all your visits, tests, and labs performed at [facility]  click here if you don't have a [facility] MyChart account or go to [email or web address]  Consent: (Patient) Ann Padilla provided verbal consent for this virtual visit at the beginning of the encounter.  Current Medications:  Current Outpatient Medications:    EPINEPHrine  0.3 mg/0.3 mL IJ SOAJ injection, Inject 0.3 mg into the muscle as needed for anaphylaxis (only for difficulty breathing, tongue swelling, lip swelling, chest pain)., Disp: 1 each, Rfl: 0   metroNIDAZOLE  (METROGEL ) 0.75 % vaginal gel, 1 applicatorful nightly for 5 days, Disp: 70 g, Rfl: 0   Medications ordered in this encounter:  Meds ordered this encounter  Medications   metroNIDAZOLE  (METROGEL ) 0.75 % vaginal gel    Sig: 1 applicatorful nightly for 5 days    Dispense:  70 g    Refill:  0    Supervising Provider:   BLAISE ALEENE KIDD [[id number]]     *If you need refills on other medications prior to your next appointment, please contact your pharmacy*  Follow-Up: Call back or seek an in-person evaluation if the symptoms worsen or if the condition fails to improve as anticipated.  [facility] [phone]  Other Instructions Vaginal Infection (Bacterial Vaginosis): What to Know  Bacterial vaginosis is an infection of the vagina. It happens when the balance of normal germs (bacteria) in the vagina changes. If you don't get treated, it can make it easier for you to get other infections from sex. These are called sexually transmitted infections (STIs). If you're pregnant, you  need to get treated right away. This infection can cause a baby to be born early or at a low birth weight. What are the causes? This infection happens when too many harmful germs grow in the vagina. You can't get this infection from toilet seats, bedsheets, swimming pools, or things that touch your vagina. What increases the risk? Having sex with a new person or more than one person. Having sex without protection. Douching. Having an intrauterine device (IUD). Smoking. Using drugs or drinking alcohol. These can lead you to do risky things. Taking certain antibiotics. Being pregnant. What are the signs or symptoms? Some females have no symptoms. Symptoms may include: A gray or white discharge from your vagina. It can be watery or foamy. A fishy smell. This can happen after sex or during your menstrual period. Itching in and around your vagina. Burning or pain when you pee. How is this treated? This infection is treated with antibiotics. These may be given to you as: A pill. A cream for your vagina. A medicine that you put into your vagina (suppository). If the infection comes back, you may need more antibiotics. Follow these instructions at home: Medicines Take your medicines as told. Take or use your antibiotics as told. Do not stop using them even if you start to feel better. General instructions If the person you have sex with is a female, tell her that you have this infection. She will need to follow up with her doctor. Female partners don't need to be treated. Do not have sex until you finish treatment.  Drink more fluids as told. Keep your vagina and butt clean. Wash these areas with warm water  each day. Wipe from front to back after you poop. If you're breastfeeding a baby, talk to your doctor if you should keep doing so during treatment. How is this prevented? Self-care Do not douche. Do not use deodorant sprays on your vagina. Wear cotton underwear. Do not wear tight  pants and pantyhose, especially in the summer. Safe sex Use condoms the correct way and every time you have sex. Use dental dams to protect yourself during oral sex. Limit how many people you have sex with. Get tested for STIs. The person you have sex with should also get tested. Drugs and alcohol Do not smoke, vape, or use nicotine or tobacco. Do not use drugs. Limit the amount of alcohol you drink because it can lead you to do risky things. Where to find more information To learn more: Go to TonerPromos.no. Click Health Topics A-Z. Type bacterial vaginosis in the search bar. American Sexual Health Association (ASHA): ashasexualhealth.org U.S. Department of Health and [employer], Office on Women's Health: TravelLesson.ca Contact a doctor if: Your symptoms don't get better, even after treatment. You have more discharge or pain when you pee. You have a fever or chills. You have pain in your belly or in the area between your hips. You have pain during sex. You bleed from your vagina between menstrual periods. This information is not intended to replace advice given to you by your health care provider. Make sure you discuss any questions you have with your health care provider. Document Revised: 08/13/2022 Document Reviewed: 08/13/2022 Elsevier Patient Education  2024 Elsevier Inc.   Boric Acid Vaginal Suppositories What is this medication? BORIC ACID (BOHR ik AS id) may support vaginal health. It may relieve the symptoms of a yeast infection, such as itching, burning, and odor. This medicine may be used for other purposes; ask your health care provider or pharmacist if you have questions. COMMON BRAND NAME(S): AZO Boric Acid with Aloe Vera, Hylafem What should I tell my care team before I take this medication? They need to know if you have any of these conditions: Diabetes Frequent infections HIV or AIDS Immune system problems An unusual or allergic reaction to boric acid,  other medications, foods, dyes, or preservatives Pregnant or trying to get pregnant Breast-feeding How should I use this medication? This medication is for use in the vagina. Do not take by mouth. Follow the directions on the prescription label. Read package directions carefully before using. Wash hands before and after use. Use this medication at bedtime, unless otherwise directed by your care team. Do not use your medication more often than directed. Do not stop using this medication except on your care team's advice. Talk to your care team about the use of this medication in children. This medication is not approved for use in children. Overdosage: If you think you have taken too much of this medicine contact a poison control center or emergency room at once. NOTE: This medicine is only for you. Do not share this medicine with others. What if I miss a dose? If you miss a dose, use it as soon as you can. If it is almost time for your next dose, use only that dose. Do not use double or extra doses. What may interact with this medication? Interactions are not expected. Do not use any other vaginal products without telling your care team. This list may not describe all possible  interactions. Give your health care provider a list of all the medicines, herbs, non-prescription drugs, or dietary supplements you use. Also tell them if you smoke, drink alcohol, or use illegal drugs. Some items may interact with your medicine. What should I watch for while using this medication? Tell your care team if your symptoms do not start to get better within a few days. It is better not to have sex until you have finished your treatment. This medication may cause condoms, diaphragms, and spermicides to not work as well. Do not rely on any of these methods to prevent sexually transmitted infections (STIs) or pregnancy while you are using this medication. Vaginal medications may come out of the vagina during treatment.  To keep the medication from getting on your clothing, wear a panty liner. The use of tampons is not recommended. To help clear up the infection, wear freshly washed cotton, not synthetic, underwear. What side effects may I notice from receiving this medication? Side effects that you should report to your care team as soon as possible: Allergic reactions--skin rash, itching, hives, swelling of the face, lips, tongue, or throat Unusual vaginal discharge, itching, or odor Side effects that usually do not require medical attention (report to your care team if they continue or are bothersome): Vaginal irritation at the application site This list may not describe all possible side effects. Call your doctor for medical advice about side effects. You may report side effects to FDA at 1-800-FDA-1088. Where should I keep my medication? Keep out of the reach of children and pets. Store in a cool, dry place between 15 and 30 degrees C (59 and 86 degrees F). Keep away from sunlight. Throw away any unused medication after the expiration date. NOTE: This sheet is a summary. It may not cover all possible information. If you have questions about this medicine, talk to your doctor, pharmacist, or health care provider.  2024 Elsevier/Gold Standard (2021-02-11 00:00:00)  If you have been instructed to have an in-person evaluation today at a local Urgent Care facility, please use the link below. It will take you to a list of all of our available [facility], including address, phone number and hours of operation. Please do not delay care.  [facility]  If you or a family member do not have a primary care provider, use the link below to schedule a visit and establish care. When you choose a [facility] primary care physician or advanced practice provider, you gain a long-term partner in health. Find a Primary Care Provider  Learn more about [facility]'s in-office and virtual care  options: [facility] - Get Care Now

## 2023-09-02 NOTE — Progress Notes (Signed)
 Virtual Visit Consent   Ann Padilla, you are scheduled for a virtual visit with a [facility] provider today. Just as with appointments in the office, your consent must be obtained to participate. Your consent will be active for this visit and any virtual visit you may have with one of our providers in the next 365 days. If you have a MyChart account, a copy of this consent can be sent to you electronically.  As this is a virtual visit, video technology does not allow for your provider to perform a traditional examination. This may limit your provider's ability to fully assess your condition. If your provider identifies any concerns that need to be evaluated in person or the need to arrange testing (such as labs, EKG, etc.), we will make arrangements to do so. Although advances in technology are sophisticated, we cannot ensure that it will always work on either your end or our end. If the connection with a video visit is poor, the visit may have to be switched to a telephone visit. With either a video or telephone visit, we are not always able to ensure that we have a secure connection.  By engaging in this virtual visit, you consent to the provision of healthcare and authorize for your insurance to be billed (if applicable) for the services provided during this visit. Depending on your insurance coverage, you may receive a charge related to this service.  I need to obtain your verbal consent now. Are you willing to proceed with your visit today? Ann Padilla has provided verbal consent on 09/02/2023 for a virtual visit (video or telephone). Ann Padilla, [place]  Date: 09/02/2023 3:13 PM   Virtual Visit via Video Note   I, Ann Padilla, connected with  Ann Padilla  ([id number], June 04, 1990) on 09/02/23 at  3:00 PM EDT by a video-enabled telemedicine application and verified that I am speaking with the correct person using two identifiers.  Location: Patient: Virtual Visit Location  Patient: Home Provider: Virtual Visit Location Provider: Home Office   I discussed the limitations of evaluation and management by telemedicine and the availability of in person appointments. The patient expressed understanding and agreed to proceed.    History of Present Illness: Ann Padilla is a 33 y.o. who identifies as a female who was assigned female at birth, and is being seen today for possible bv after using a different soap over the weekend. Notes symptoms starting 3 days ago with vaginal irritation and foul-smelling discharge. Denies fever, chills. LMP was beginning of this month. Denies concern for pregnancy or STI.  HPI: HPI  Problems: There are no active problems to display for this patient.   Allergies:  Allergies  Allergen Reactions   Bee Venom Anaphylaxis   Fire Ant Anaphylaxis   Yellow Jacket Venom [Bee Venom]    Medications:  Current Outpatient Medications:    EPINEPHrine  0.3 mg/0.3 mL IJ SOAJ injection, Inject 0.3 mg into the muscle as needed for anaphylaxis (only for difficulty breathing, tongue swelling, lip swelling, chest pain)., Disp: 1 each, Rfl: 0   metroNIDAZOLE  (METROGEL ) 0.75 % vaginal gel, 1 applicatorful nightly for 5 days, Disp: 70 g, Rfl: 0  Observations/Objective: Patient is well-developed, well-nourished in no acute distress.  Resting comfortably at home.  Head is normocephalic, atraumatic.  No labored breathing. Speech is clear and coherent with logical content.  Patient is alert and oriented at baseline.   Assessment and Plan: 1. BV (bacterial vaginosis) - metroNIDAZOLE  (METROGEL ) 0.75 % vaginal gel;  1 applicatorful nightly for 5 days  Dispense: 70 g; Refill: 0  Start Metrogel  per patient preference. Discussed need to avoid scented or dyed soaps, lotions or other feminine hygiene products to reduce risk of bv. Recommend she discuss boric acid va supp with her PCP.  Follow Up Instructions: I discussed the assessment and treatment plan with  the patient. The patient was provided an opportunity to ask questions and all were answered. The patient agreed with the plan and demonstrated an understanding of the instructions.  A copy of instructions were sent to the patient via MyChart unless otherwise noted below.    The patient was advised to call back or seek an in-person evaluation if the symptoms worsen or if the condition fails to improve as anticipated.    Ann Velma Lunger, PA-C

## 2024-01-05 NOTE — Congregational Nurse Program (Signed)
 Nurse met with client and her 4 children [facility] K. McDougald [birth year] 2010; Maaliyah N. McDougald 02 26 2010; Mariah I. McDougald 05 17 2011; Kyla R. Phillips 12 21 2017). Client reports this is first time being unhoused. Client reports no medical or mental health issues with herself or children with the exception of seasonal allergies. Emery does also have eczema and uses a cream when needed.Client states that she has a PCP at [facility], however would like to get a pediatrician for the children. They do have a local dental practice ([facility]). Plan:  Nurse explained her role here at shelter.  Nurse will e-mail client a list of Pediatricians who take Medicaid for her to choose from and contact for wellness visit.  Nurse will follow up with this family as needed.  Desree Leap D. Robynn MSN, Chartered Loss Adjuster Nurse [facility] [phone]
